# Patient Record
Sex: Female | Born: 1980 | Race: Black or African American | Hispanic: No | Marital: Single | Smoking: Former smoker
Health system: Southern US, Community
[De-identification: ages and names within clinical notes are randomized; demographics above are authoritative.]

## PROBLEM LIST (undated history)

## (undated) DIAGNOSIS — D649 Anemia, unspecified: Secondary | ICD-10-CM

---

## 2000-12-26 ENCOUNTER — Emergency Department (HOSPITAL_COMMUNITY): Admission: EM | Admit: 2000-12-26 | Discharge: 2000-12-27 | Payer: Self-pay | Admitting: Emergency Medicine

## 2002-08-01 ENCOUNTER — Encounter (INDEPENDENT_AMBULATORY_CARE_PROVIDER_SITE_OTHER): Payer: Self-pay | Admitting: *Deleted

## 2002-08-04 ENCOUNTER — Other Ambulatory Visit: Admission: RE | Admit: 2002-08-04 | Discharge: 2002-08-04 | Payer: Self-pay | Admitting: Sports Medicine

## 2002-08-04 ENCOUNTER — Encounter: Admission: RE | Admit: 2002-08-04 | Discharge: 2002-08-04 | Payer: Self-pay | Admitting: Sports Medicine

## 2004-07-10 ENCOUNTER — Emergency Department (HOSPITAL_COMMUNITY): Admission: EM | Admit: 2004-07-10 | Discharge: 2004-07-10 | Payer: Self-pay | Admitting: Emergency Medicine

## 2006-02-21 ENCOUNTER — Inpatient Hospital Stay (HOSPITAL_COMMUNITY): Admission: AD | Admit: 2006-02-21 | Discharge: 2006-02-21 | Payer: Self-pay | Admitting: Gynecology

## 2006-02-24 ENCOUNTER — Inpatient Hospital Stay (HOSPITAL_COMMUNITY): Admission: AD | Admit: 2006-02-24 | Discharge: 2006-02-24 | Payer: Self-pay | Admitting: Obstetrics and Gynecology

## 2006-10-30 ENCOUNTER — Encounter (INDEPENDENT_AMBULATORY_CARE_PROVIDER_SITE_OTHER): Payer: Self-pay | Admitting: *Deleted

## 2006-11-18 ENCOUNTER — Inpatient Hospital Stay (HOSPITAL_COMMUNITY): Admission: AD | Admit: 2006-11-18 | Discharge: 2006-11-18 | Payer: Self-pay | Admitting: Family Medicine

## 2007-02-24 ENCOUNTER — Inpatient Hospital Stay (HOSPITAL_COMMUNITY): Admission: AD | Admit: 2007-02-24 | Discharge: 2007-02-24 | Payer: Self-pay | Admitting: Obstetrics

## 2007-07-18 ENCOUNTER — Inpatient Hospital Stay (HOSPITAL_COMMUNITY): Admission: AD | Admit: 2007-07-18 | Discharge: 2007-07-21 | Payer: Self-pay | Admitting: Obstetrics

## 2008-05-08 IMAGING — US US OB COMP LESS 14 WK
1 series · 14 of 28 positions shown · non-contrast
Comparison: none

CLINICAL DATA: 8 weeks pregnant by dates, vaginal bleeding.  
 OBSTETRICAL ULTRASOUND <14 WKS AND TRANSVAGINAL OB US:
TECHNIQUE: Both transabdominal and transvaginal ultrasound examinations were performed for complete evaluation of the gestation as well as the maternal uterus, adnexal regions, and pelvic cul-de-sac.

[Series 1: us ob comp less 14 wks · 14 of 29 slices shown]
[im 2/29]
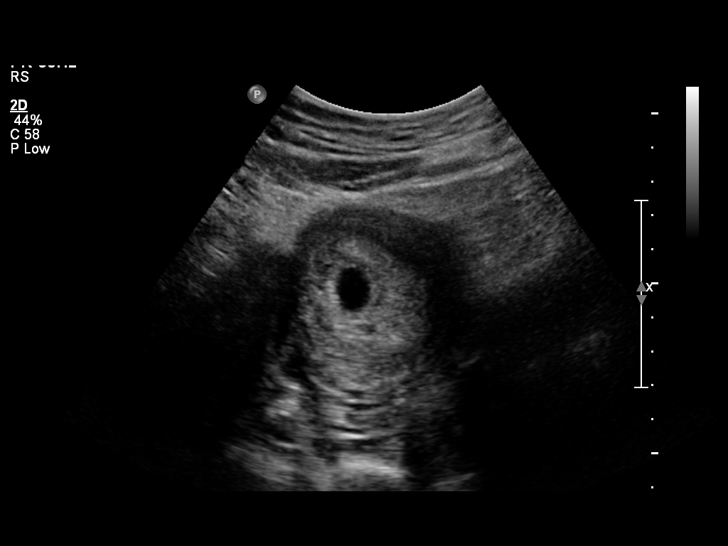
[im 4/29]
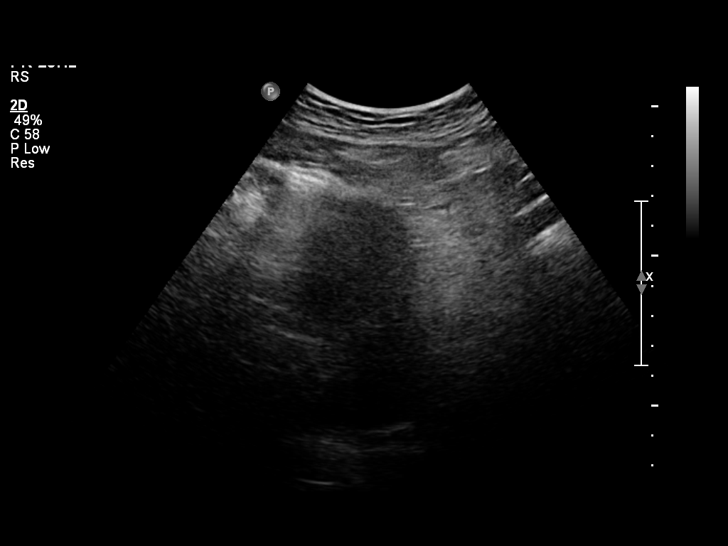
[im 6/29]
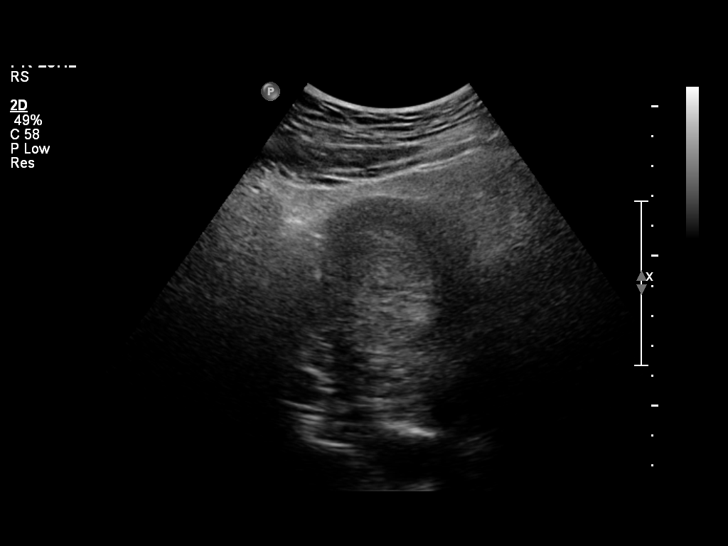
[im 8/29]
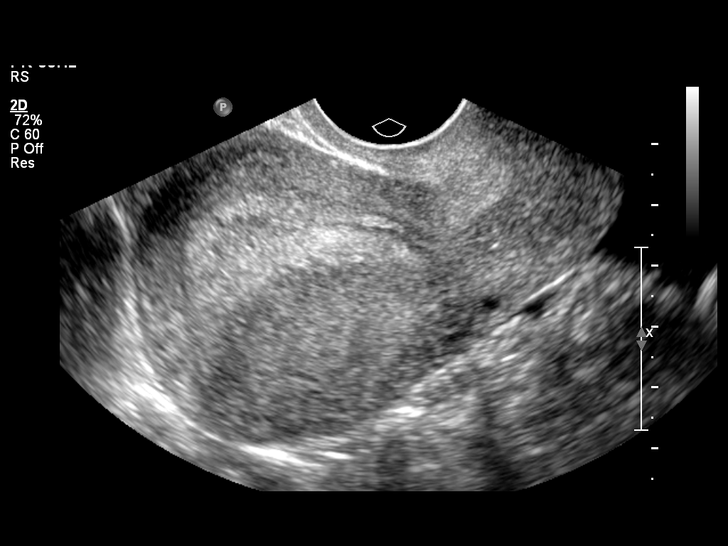
[im 10/29]
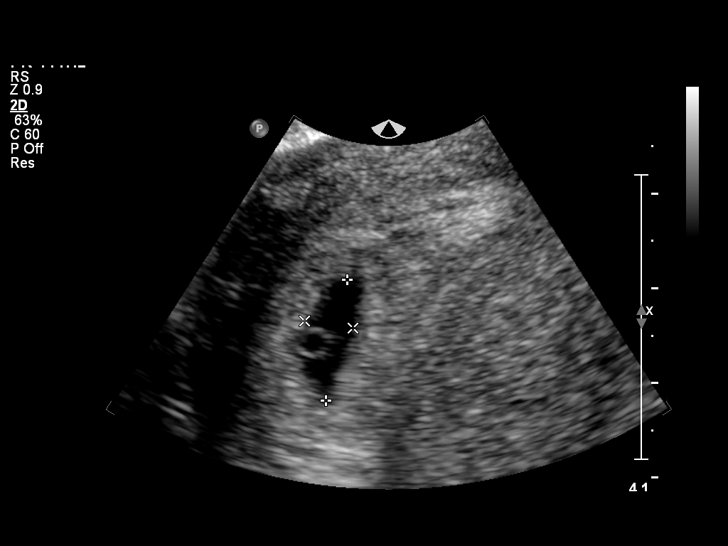
[im 12/29]
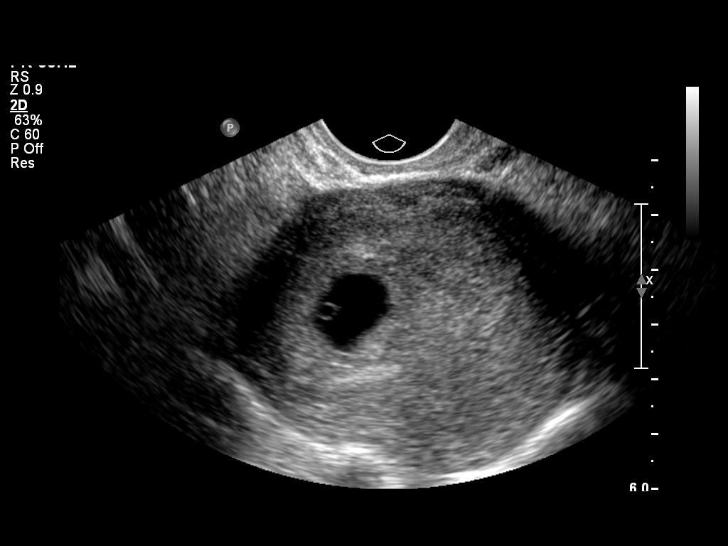
[im 14/29]
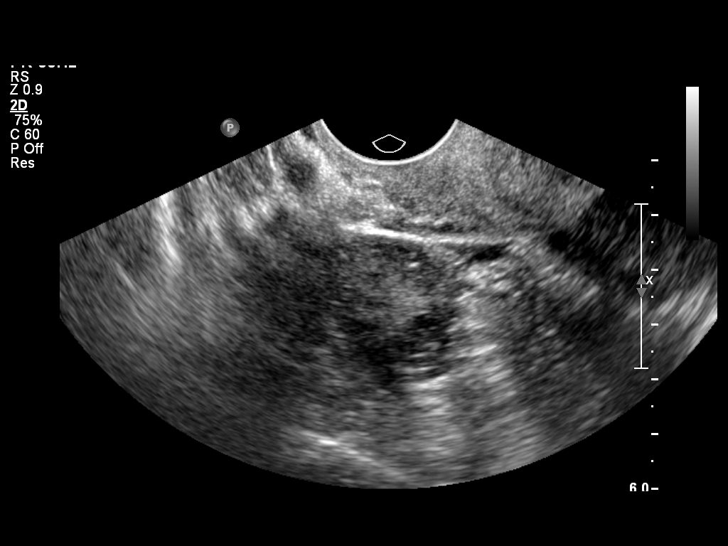
[im 16/29]
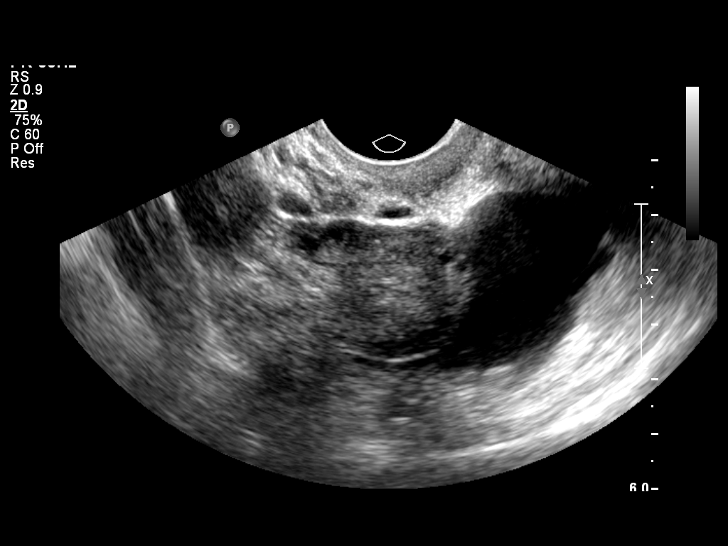
[im 18/29]
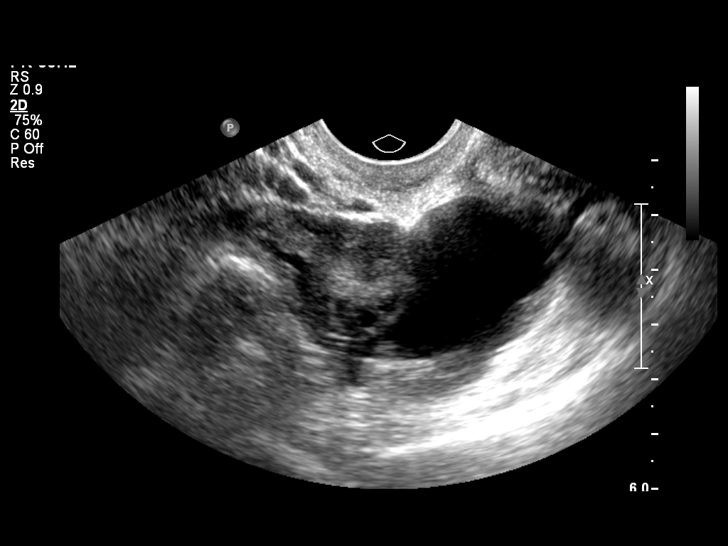
[im 20/29]
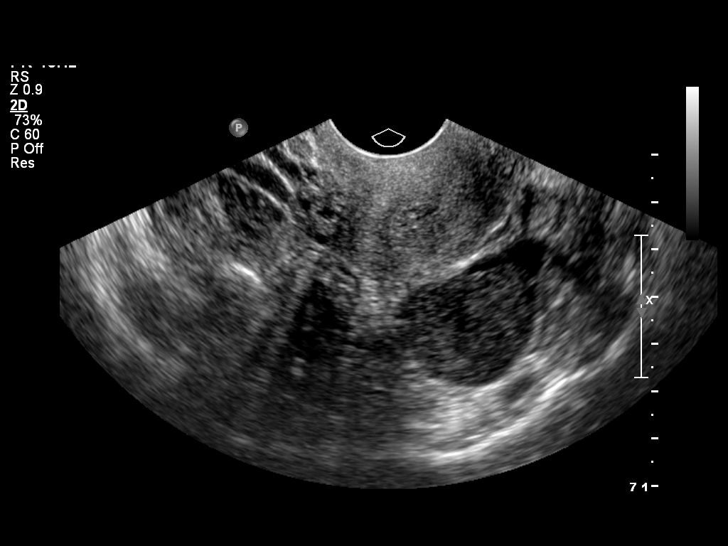
[im 22/29]
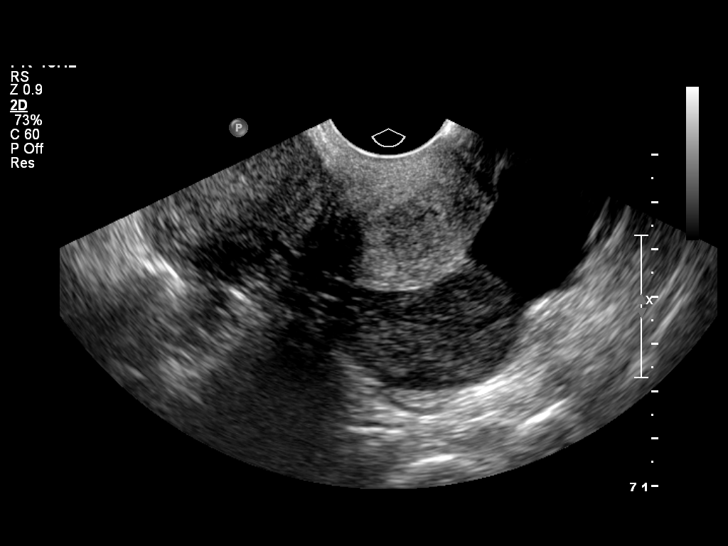
[im 24/29]
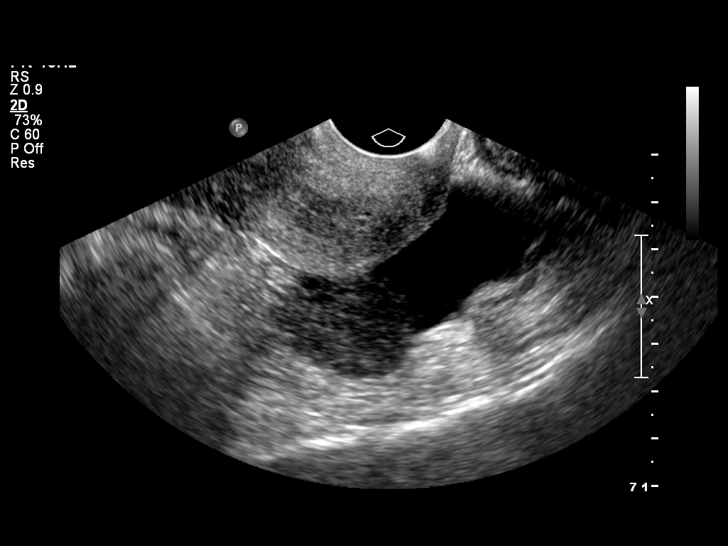
[im 26/29]
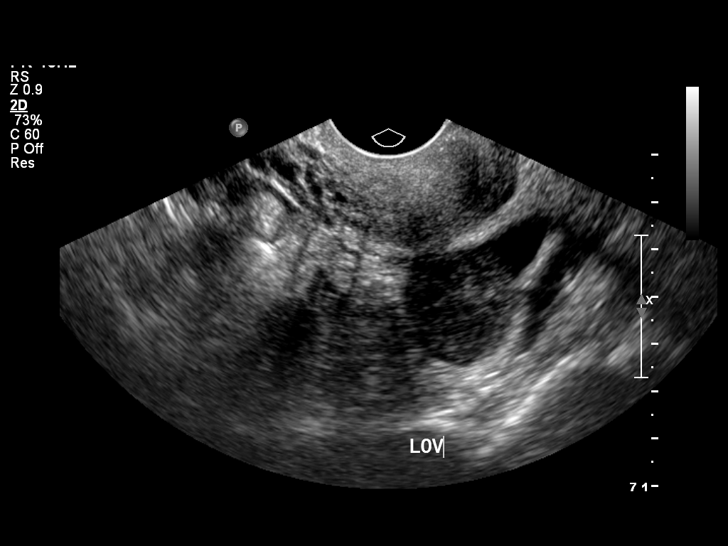
[im 29/29]
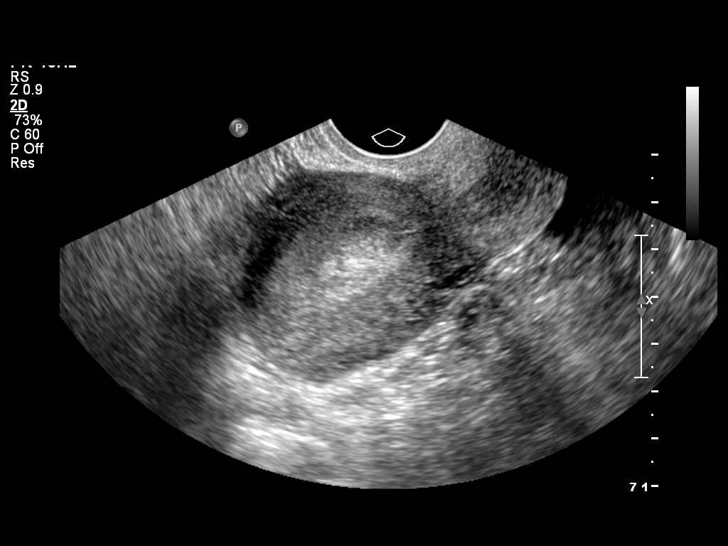

[14 of 28 positions shown; findings below may reference images not displayed]

FINDINGS: There is an intrauterine gestational sac with yolk sac present but no cardiac activity or fetal pole yet visualized.  Gestational age 5 weeks 5 days by mean sac diameter of 1 cm.  Ovaries are normal.  Small to moderate free fluid present.
IMPRESSION: Intrauterine gestational sac with average ultrasound age of 5 weeks 5 days by mean sac diameter.  No fetal pole or cardiac activity yet visualized.

## 2011-01-14 NOTE — Op Note (Signed)
NAMEGRAVIELA, Kayla Proctor                  ACCOUNT NO.:  [id number]   MEDICAL RECORD NO.:  [id number]          PATIENT TYPE:  INP   LOCATION:  9163                          FACILITY:  [facility]   PHYSICIAN:  Kathreen Cosier, M.D.DATE OF BIRTH:  02-25-1981   DATE OF PROCEDURE:  07/19/2007  DATE OF DISCHARGE:                               OPERATIVE REPORT   PROCEDURE PERFORMED:  Vacuum application and delivery.   PREOPERATIVE DIAGNOSIS:  Application of vacuum at +3 station.   POSTOPERATIVE DIAGNOSIS:  Application of vacuum at +3 station.   PROCEDURE:  The patient was fully dilated for more than an hour and was  offered vacuum because she was tired and she accepted.  Midline  episiotomy was performed and the vacuum applied at +3 station.  She had  a normal vaginal delivery from the LOA position of a female Apgar 08/09,  weighing 7 pounds 5 ounces.  There was a fourth degree extension and the  rectal mucosa was repaired in two layers and the anal sphincter repaired  in the usual manner.  Post repair the rectum was intact and the  sphincter was intact.  The placenta was delivered spontaneously. During  application of the vacuum, there was no pop-off and she delivered during  one contraction.           ______________________________  Kathreen Cosier, M.D.     BAM/MEDQ  D:  07/19/2007  T:  07/19/2007  Job:  [id number]

## 2011-06-10 LAB — CBC
HCT: 29 — ABNORMAL LOW
Hemoglobin: 11.4 — ABNORMAL LOW
MCHC: 32.6
MCV: 78.9
MCV: 79.8
Platelets: 242
RBC: 4.41
RDW: 17.4 — ABNORMAL HIGH

## 2011-06-18 LAB — URINALYSIS, ROUTINE W REFLEX MICROSCOPIC
Bilirubin Urine: NEGATIVE
Glucose, UA: NEGATIVE
Hgb urine dipstick: NEGATIVE
Ketones, ur: NEGATIVE
Nitrite: NEGATIVE
Protein, ur: NEGATIVE
Specific Gravity, Urine: 1.02
Urobilinogen, UA: 0.2
pH: 7

## 2012-05-10 ENCOUNTER — Encounter (HOSPITAL_COMMUNITY): Payer: Self-pay | Admitting: Family Medicine

## 2012-05-10 ENCOUNTER — Emergency Department (HOSPITAL_COMMUNITY)
Admission: EM | Admit: 2012-05-10 | Discharge: 2012-05-10 | Disposition: A | Discharge status: Home / Self Care | Payer: Medicare Other | Attending: Emergency Medicine | Admitting: Emergency Medicine

## 2012-05-10 DIAGNOSIS — J309 Allergic rhinitis, unspecified: Secondary | ICD-10-CM | POA: Insufficient documentation

## 2012-05-10 DIAGNOSIS — Z87891 Personal history of nicotine dependence: Secondary | ICD-10-CM | POA: Insufficient documentation

## 2012-05-10 DIAGNOSIS — J302 Other seasonal allergic rhinitis: Secondary | ICD-10-CM

## 2012-05-10 MED ORDER — LORATADINE 10 MG PO TABS: 10.0000 mg | ORAL_TABLET | Freq: Every day | ORAL | Status: DC

## 2012-05-10 NOTE — ED Provider Notes (Signed)
History     CSN: [id number]  Arrival date & time 05/10/12  1327   First MD Initiated Contact with Patient 05/10/12 1815      Chief Complaint  Patient presents with  . Nasal Congestion  . Shortness of Breath    (Consider location/radiation/quality/duration/timing/severity/associated sxs/prior treatment) Patient is a 31 y.o. female presenting with URI. The history is provided by the patient.  URI The primary symptoms include swollen glands and cough. Primary symptoms do not include fever, abdominal pain, nausea or vomiting.  Symptoms associated with the illness include facial pain, sinus pressure, congestion and rhinorrhea. Associated symptoms comments: Symptoms of nasal congestion, runny nose, facial pressure, mild cough and sensation of wheezing that she gets about the same time every year. No fever, N, V..    History reviewed. No pertinent past medical history.  History reviewed. No pertinent past surgical history.  History reviewed. No pertinent family history.  History  Substance Use Topics  . Smoking status: Former Smoker    Types: Cigarettes    Quit date: 05/03/2011  . Smokeless tobacco: Not on file  . Alcohol Use: No    OB History    Grav Para Term Preterm Abortions TAB SAB Ect Mult Living                  Review of Systems  Constitutional: Negative for fever.  HENT: Positive for congestion, rhinorrhea, sneezing and sinus pressure.   Eyes: Negative for discharge and itching.  Respiratory: Positive for cough and shortness of breath.   Cardiovascular: Negative for chest pain.  Gastrointestinal: Negative for nausea, vomiting and abdominal pain.  Genitourinary: Negative for dysuria.    Allergies  Review of patient's allergies indicates no known allergies.  Home Medications  No current outpatient prescriptions on file.  BP 110/67  Pulse 83  Temp 98.5 F (36.9 C) (Oral)  Resp 20  SpO2 100%  LMP 04/16/2012  Physical Exam  Constitutional: She appears  well-developed and well-nourished. No distress.  HENT:  Head: Normocephalic.  Right Ear: Tympanic membrane is not injected and not erythematous. A middle ear effusion is present.  Left Ear: Tympanic membrane is not injected and not erythematous.  No middle ear effusion.  Nose: Mucosal edema and rhinorrhea present.  Mouth/Throat: Mucous membranes are not dry. No oropharyngeal exudate or posterior oropharyngeal erythema.  Eyes: Conjunctivae are normal. Pupils are equal, round, and reactive to light.  Neck: Normal range of motion.  Cardiovascular: Normal rate and regular rhythm.   No murmur heard. Pulmonary/Chest: Effort normal. She has no wheezes. She has no rales. She exhibits no tenderness.  Abdominal: Soft. There is no tenderness.  Lymphadenopathy:    She has no cervical adenopathy.    ED Course  Procedures (including critical care time)  Labs Reviewed - No data to display No results found.   No diagnosis found.  1. Allergies   MDM  Suspect seasonal allergies.        Rodena Medin, PA-C 05/10/12 1919

## 2012-05-10 NOTE — ED Notes (Signed)
Pt reports having nasal congestion and sob occasionally. States it started last week where she gets sob on occasion. Pt states this feels similar to when she had a sinus infection previously. Denies any pain.

## 2012-05-13 NOTE — ED Provider Notes (Signed)
Medical screening examination/treatment/procedure(s) were performed by non-physician practitioner and as supervising physician I was immediately available for consultation/collaboration.   Zayvion Stailey L Mar Walmer, MD 05/13/12 1027 

## 2012-07-13 ENCOUNTER — Emergency Department (INDEPENDENT_AMBULATORY_CARE_PROVIDER_SITE_OTHER)
Admission: EM | Admit: 2012-07-13 | Discharge: 2012-07-13 | Disposition: A | Discharge status: Home / Self Care | Payer: Medicare Other | Source: Home / Self Care | Attending: Emergency Medicine | Admitting: Emergency Medicine

## 2012-07-13 ENCOUNTER — Encounter (HOSPITAL_COMMUNITY): Payer: Self-pay | Admitting: Emergency Medicine

## 2012-07-13 DIAGNOSIS — J45909 Unspecified asthma, uncomplicated: Secondary | ICD-10-CM

## 2012-07-13 DIAGNOSIS — J029 Acute pharyngitis, unspecified: Secondary | ICD-10-CM

## 2012-07-13 HISTORY — DX: Anemia, unspecified: D64.9

## 2012-07-13 MED ORDER — ALBUTEROL SULFATE HFA 108 (90 BASE) MCG/ACT IN AERS: 1.0000 | INHALATION_SPRAY | Freq: Four times a day (QID) | RESPIRATORY_TRACT | Status: DC | PRN

## 2012-07-13 MED ORDER — PREDNISONE 20 MG PO TABS: 20.0000 mg | ORAL_TABLET | Freq: Every day | ORAL | Status: DC

## 2012-07-13 NOTE — ED Notes (Signed)
Pt having congestion, dyspnea, and sob since last night when taking a shower. She has also had popping in her ears and a runny nose.

## 2012-07-13 NOTE — ED Provider Notes (Signed)
History     CSN: [id number]  Arrival date & time 07/13/12  1732   First MD Initiated Contact with Patient 07/13/12 1739      No chief complaint on file.   (Consider location/radiation/quality/duration/timing/severity/associated sxs/prior treatment) HPI Comments: Patient presents urgent care describing that after she took a shower last night she's been congested of her nose and her throat felt congested and with phlegm. Has also been coughing denies any fevers or shortness of breath at this point. Has some difficulty swallowing denies any facial swelling tongue swelling or generalized rash is generalized malaise.  The history is provided by the patient.    No past medical history on file.  No past surgical history on file.  No family history on file.  History  Substance Use Topics  . Smoking status: Former Smoker    Types: Cigarettes    Quit date: 05/03/2011  . Smokeless tobacco: Not on file  . Alcohol Use: No    OB History    Grav Para Term Preterm Abortions TAB SAB Ect Mult Living                  Review of Systems  Constitutional: Positive for appetite change. Negative for fever and chills.  HENT: Positive for congestion, sore throat, rhinorrhea and trouble swallowing. Negative for ear pain, neck pain and sinus pressure.   Eyes: Negative for discharge.  Respiratory: Positive for cough. Negative for chest tightness, shortness of breath, wheezing and stridor.   Genitourinary: Negative for dysuria.  Skin: Negative for color change and rash.  Neurological: Negative for dizziness, tremors and headaches.    Allergies  Review of patient's allergies indicates no known allergies.  Home Medications   Current Outpatient Rx  Name  Route  Sig  Dispense  Refill  . LORATADINE 10 MG PO TABS   Oral   Take 1 tablet (10 mg total) by mouth daily.   7 tablet   0     BP 109/68  Pulse 101  Temp 98.5 F (36.9 C) (Oral)  Resp 18  SpO2 100%  Physical Exam  Nursing  note and vitals reviewed. Constitutional: She appears well-developed and well-nourished.  HENT:  Head: Normocephalic.  Right Ear: Tympanic membrane normal.  Left Ear: Tympanic membrane normal.  Mouth/Throat: Uvula is midline and mucous membranes are normal. Posterior oropharyngeal erythema present. No oropharyngeal exudate, posterior oropharyngeal edema or tonsillar abscesses.  Eyes: Conjunctivae normal are normal. Right eye exhibits no discharge. Left eye exhibits no discharge.  Neck: Trachea normal. Neck supple. No JVD present. No thyromegaly present.  Cardiovascular: Normal rate.   No murmur heard. Pulmonary/Chest: Effort normal and breath sounds normal. No respiratory distress.  Lymphadenopathy:    She has no cervical adenopathy.  Skin: Skin is warm. No erythema.    ED Course  Procedures (including critical care time)  Labs Reviewed - No data to display No results found.   No diagnosis found.    MDM  Sore throat Normal exam- prednisone and codeine with guafinesin        Jimmie Molly, MD 07/13/12 1843

## 2012-09-17 ENCOUNTER — Encounter (HOSPITAL_COMMUNITY): Payer: Self-pay | Admitting: *Deleted

## 2012-09-17 ENCOUNTER — Emergency Department (HOSPITAL_COMMUNITY)
Admission: EM | Admit: 2012-09-17 | Discharge: 2012-09-17 | Disposition: A | Discharge status: Home / Self Care | Payer: Medicare Other | Attending: Emergency Medicine | Admitting: Emergency Medicine

## 2012-09-17 DIAGNOSIS — R079 Chest pain, unspecified: Secondary | ICD-10-CM | POA: Insufficient documentation

## 2012-09-17 DIAGNOSIS — D649 Anemia, unspecified: Secondary | ICD-10-CM | POA: Insufficient documentation

## 2012-09-17 DIAGNOSIS — R0681 Apnea, not elsewhere classified: Secondary | ICD-10-CM | POA: Insufficient documentation

## 2012-09-17 DIAGNOSIS — K089 Disorder of teeth and supporting structures, unspecified: Secondary | ICD-10-CM | POA: Insufficient documentation

## 2012-09-17 DIAGNOSIS — Z87891 Personal history of nicotine dependence: Secondary | ICD-10-CM | POA: Insufficient documentation

## 2012-09-17 DIAGNOSIS — K219 Gastro-esophageal reflux disease without esophagitis: Secondary | ICD-10-CM | POA: Insufficient documentation

## 2012-09-17 DIAGNOSIS — K0889 Other specified disorders of teeth and supporting structures: Secondary | ICD-10-CM

## 2012-09-17 DIAGNOSIS — Z79899 Other long term (current) drug therapy: Secondary | ICD-10-CM | POA: Insufficient documentation

## 2012-09-17 LAB — POCT I-STAT, CHEM 8
Calcium, Ion: 1.22 mmol/L (ref 1.12–1.23)
Creatinine, Ser: 0.7 mg/dL (ref 0.50–1.10)
Hemoglobin: 10.9 g/dL — ABNORMAL LOW (ref 12.0–15.0)
Sodium: 142 mEq/L (ref 135–145)
TCO2: 24 mmol/L (ref 0–100)

## 2012-09-17 LAB — CBC
Hemoglobin: 9.8 g/dL — ABNORMAL LOW (ref 12.0–15.0)
MCH: 22.4 pg — ABNORMAL LOW (ref 26.0–34.0)
MCHC: 29.5 g/dL — ABNORMAL LOW (ref 30.0–36.0)

## 2012-09-17 LAB — POCT I-STAT TROPONIN I

## 2012-09-17 MED ORDER — PENICILLIN V POTASSIUM 500 MG PO TABS: 500.0000 mg | ORAL_TABLET | Freq: Three times a day (TID) | ORAL | Status: DC

## 2012-09-17 MED ORDER — GI COCKTAIL ~~LOC~~
30.0000 mL | Freq: Once | ORAL | Status: AC
  Administered 2012-09-17: 30 mL via ORAL
  Filled 2012-09-17: qty 30

## 2012-09-17 MED ORDER — FAMOTIDINE 20 MG PO TABS
20.0000 mg | ORAL_TABLET | Freq: Once | ORAL | Status: AC
  Administered 2012-09-17: 20 mg via ORAL
  Filled 2012-09-17: qty 1

## 2012-09-17 MED ORDER — PANTOPRAZOLE SODIUM 40 MG PO TBEC
40.0000 mg | DELAYED_RELEASE_TABLET | Freq: Once | ORAL | Status: AC
  Administered 2012-09-17: 40 mg via ORAL
  Filled 2012-09-17: qty 1

## 2012-09-17 MED ORDER — PANTOPRAZOLE SODIUM 40 MG PO TBEC: 40.0000 mg | DELAYED_RELEASE_TABLET | Freq: Every day | ORAL | Status: DC

## 2012-09-17 MED ORDER — OMEPRAZOLE 20 MG PO CPDR: 20.0000 mg | DELAYED_RELEASE_CAPSULE | Freq: Every day | ORAL | Status: DC

## 2012-09-17 NOTE — ED Notes (Signed)
Dental pain: rt. Lower . Chest tightness when laying down.

## 2012-09-17 NOTE — ED Provider Notes (Signed)
History     CSN: [id number]  Arrival date & time 09/17/12  0227   First MD Initiated Contact with Patient 09/17/12 [phone]      Chief Complaint  Patient presents with  . Dental Pain  . Chest Pain    (Consider location/radiation/quality/duration/timing/severity/associated sxs/prior treatment) Patient is a 32 y.o. female presenting with tooth pain and chest pain. The history is provided by the patient. No language interpreter was used.  Dental PainPrimary symptoms do not include cough. Primary symptoms comment: tooth pain The symptoms began 2 days ago. The symptoms are unchanged. The symptoms are new. The symptoms occur constantly.  Additional symptoms include: dental sensitivity to temperature. Additional symptoms do not include: gum swelling and pain with swallowing. Medical issues do not include: alcohol problem.   Chest Pain The chest pain began 2 days ago. Chest pain occurs constantly. The chest pain is unchanged. The pain is associated with eating. The severity of the pain is mild. The quality of the pain is described as burning (worst when laying flat but burning and rising x 2 days ). The pain does not radiate. Chest pain is worsened by eating. Pertinent negatives for primary symptoms include no cough, no wheezing, no nausea and no vomiting. Primary symptoms comment: tooth pain  Pertinent negatives for associated symptoms include no diaphoresis. She tried nothing for the symptoms. There are no known risk factors.  Pertinent negatives for past medical history include no CAD and no PE.   PERC negative and wells 0  Past Medical History  Diagnosis Date  . Anemia     History reviewed. No pertinent past surgical history.  History reviewed. No pertinent family history.  History  Substance Use Topics  . Smoking status: Former Smoker    Types: Cigarettes    Quit date: 05/03/2011  . Smokeless tobacco: Not on file  . Alcohol Use: No    OB History    Grav Para Term Preterm  Abortions TAB SAB Ect Mult Living                  Review of Systems  Constitutional: Negative for diaphoresis.  Respiratory: Negative for cough and wheezing.   Cardiovascular: Positive for chest pain.  Gastrointestinal: Negative for nausea and vomiting.  All other systems reviewed and are negative.    Allergies  Review of patient's allergies indicates no known allergies.  Home Medications   Current Outpatient Rx  Name  Route  Sig  Dispense  Refill  . ALBUTEROL SULFATE HFA 108 (90 BASE) MCG/ACT IN AERS   Inhalation   Inhale 1-2 puffs into the lungs every 6 (six) hours as needed for wheezing.   1 Inhaler   0   . FERROUS SULFATE 325 (65 FE) MG PO TABS   Oral   Take 325 mg by mouth daily with breakfast.         . LORATADINE 10 MG PO TABS   Oral   Take 1 tablet (10 mg total) by mouth daily.   7 tablet   0     BP 116/58  Pulse 81  Temp 98.4 F (36.9 C) (Oral)  Resp 20  SpO2 100%  LMP 08/26/2012  Physical Exam  Constitutional: She is oriented to person, place, and time. She appears well-developed and well-nourished. No distress.  HENT:  Head: Normocephalic and atraumatic.  Mouth/Throat: Oropharynx is clear and moist.    Eyes: Conjunctivae normal are normal. Pupils are equal, round, and reactive to light.  Neck: Normal  range of motion. Neck supple.  Cardiovascular: Normal rate, regular rhythm and intact distal pulses.   Pulmonary/Chest: Effort normal and breath sounds normal. She has no wheezes. She has no rales.  Abdominal: Soft. Bowel sounds are normal. There is no tenderness. There is no rebound and no guarding.  Musculoskeletal: Normal range of motion.  Neurological: She is alert and oriented to person, place, and time. She has normal reflexes.  Skin: Skin is warm and dry. She is not diaphoretic.  Psychiatric: She has a normal mood and affect.    ED Course  Procedures (including critical care time)  Labs Reviewed  CBC - Abnormal; Notable for the  following:    Hemoglobin 9.8 (*)     HCT 33.2 (*)     MCV 75.8 (*)     MCH 22.4 (*)     MCHC 29.5 (*)     All other components within normal limits  POCT I-STAT, CHEM 8 - Abnormal; Notable for the following:    Hemoglobin 10.9 (*)     HCT 32.0 (*)     All other components within normal limits  POCT I-STAT TROPONIN I   No results found.   No diagnosis found.    MDM  Sx consistent with GERD.  In the setting of > 8 hours of ongoing indigestion symptoms now relieve of GI cocktail and PPI, one negative troponin and EKG is sufficient to exclude ACS.  Follow up with your doctor and dentist.  Return for chest pain shortness of breath or any concerns    Date: 09/17/2012  Rate: 85   Rhythm: normal sinus rhythm  QRS Axis: normal  Intervals: normal  ST/T Wave abnormalities: normal  Conduction Disutrbances: PAC  Narrative Interpretation: unremarkable         Lillyona Polasek Smitty Cords, MD 09/17/12 [phone]

## 2012-10-18 ENCOUNTER — Encounter (HOSPITAL_COMMUNITY): Payer: Self-pay | Admitting: Emergency Medicine

## 2012-10-18 ENCOUNTER — Emergency Department (INDEPENDENT_AMBULATORY_CARE_PROVIDER_SITE_OTHER)
Admission: EM | Admit: 2012-10-18 | Discharge: 2012-10-18 | Disposition: A | Discharge status: Home / Self Care | Payer: Medicare Other | Source: Home / Self Care

## 2012-10-18 DIAGNOSIS — J329 Chronic sinusitis, unspecified: Secondary | ICD-10-CM

## 2012-10-18 MED ORDER — ACETAMINOPHEN-CODEINE #3 300-30 MG PO TABS: 1.0000 | ORAL_TABLET | Freq: Four times a day (QID) | ORAL | Status: DC | PRN

## 2012-10-18 MED ORDER — PHENYLEPHRINE-CHLORPHEN-DM 10-4-12.5 MG/5ML PO LIQD: 5.0000 mL | ORAL | Status: DC | PRN

## 2012-10-18 NOTE — ED Notes (Signed)
Waiting discharge papers 

## 2012-10-18 NOTE — ED Provider Notes (Signed)
History     CSN: [id number]  Arrival date & time 10/18/12  1751   First MD Initiated Contact with Patient 10/18/12 1759      Chief Complaint  Patient presents with  . URI    cough and congestion with headache.     (Consider location/radiation/quality/duration/timing/severity/associated sxs/prior treatment) HPI Comments: This 32 year old female is complaining of nasal congestion and facial pressure associated with headache for 2 days. She together in her house 2 days ago and others that were in the house developed similar symptoms. She states since last summer she has had intermittent sinusitis symptoms that she has attributed to mold that has been found in the house. She states her house is several decades old and knows of places in the home that he is molded. She denies fever, chills or sore throat. She does have nasal congestion facial pressure especially with leaning forward and a headache in the frontal and left parietal areas. Many of these symptoms are intermittent. She denies focal neurologic deficits. No problems with vision speech hearing or swallowing.   Past Medical History  Diagnosis Date  . Anemia     History reviewed. No pertinent past surgical history.  History reviewed. No pertinent family history.  History  Substance Use Topics  . Smoking status: Former Smoker    Types: Cigarettes    Quit date: 05/03/2011  . Smokeless tobacco: Not on file  . Alcohol Use: No    OB History   Grav Para Term Preterm Abortions TAB SAB Ect Mult Living                  Review of Systems  Constitutional: Negative for fever, chills, activity change, appetite change and fatigue.  HENT: Positive for congestion, rhinorrhea, postnasal drip and sinus pressure. Negative for sore throat, facial swelling, neck pain and neck stiffness.   Eyes: Negative.   Respiratory: Negative.   Cardiovascular: Negative.   Genitourinary: Negative.   Skin: Negative for pallor and rash.  Neurological:  Positive for light-headedness and headaches.    Allergies  Review of patient's allergies indicates no known allergies.  Home Medications   Current Outpatient Rx  Name  Route  Sig  Dispense  Refill  . omeprazole (PRILOSEC) 20 MG capsule   Oral   Take 1 capsule (20 mg total) by mouth daily.   30 capsule   0   . acetaminophen-codeine (TYLENOL #3) 300-30 MG per tablet   Oral   Take 1-2 tablets by mouth every 6 (six) hours as needed for pain.   15 tablet   0   . albuterol (PROVENTIL HFA;VENTOLIN HFA) 108 (90 BASE) MCG/ACT inhaler   Inhalation   Inhale 1-2 puffs into the lungs every 6 (six) hours as needed for wheezing.   1 Inhaler   0   . ferrous sulfate 325 (65 FE) MG tablet   Oral   Take 325 mg by mouth daily with breakfast.         . loratadine (CLARITIN) 10 MG tablet   Oral   Take 1 tablet (10 mg total) by mouth daily.   7 tablet   0   . penicillin v potassium (VEETID) 500 MG tablet   Oral   Take 1 tablet (500 mg total) by mouth 3 (three) times daily.   30 tablet   0   . Phenylephrine-Chlorphen-DM 06-05-11.5 MG/5ML LIQD   Oral   Take 5 mLs by mouth every 4 (four) hours as needed.   120 mL  0     BP 115/69  Pulse 77  Temp(Src) 97.5 F (36.4 C) (Oral)  Resp 18  SpO2 100%  LMP 09/26/2012  Physical Exam  Nursing note and vitals reviewed. Constitutional: She is oriented to person, place, and time. She appears well-developed and well-nourished. No distress.  HENT:  Head: Normocephalic.  Bilateral TMs are normal Oropharynx with mild erythema and clear and pale green drainage. No exudate  Eyes: Conjunctivae and EOM are normal.  Neck: Normal range of motion. Neck supple.  Cardiovascular: Normal rate, regular rhythm and normal heart sounds.   Pulmonary/Chest: Effort normal and breath sounds normal. No respiratory distress. She has no wheezes. She has no rales.  Musculoskeletal: Normal range of motion. She exhibits no edema.  Lymphadenopathy:    She has  no cervical adenopathy.  Neurological: She is alert and oriented to person, place, and time.  Skin: Skin is warm and dry. No rash noted.  Psychiatric: She has a normal mood and affect.    ED Course  Procedures (including critical care time)  Labs Reviewed - No data to display No results found.   1. Sinusitis       MDM  Is difficult to know whether the sinusitis is caused by mold and a home or not. Most likely is due to a virus. I would recommend you call the health department tomorrow and asked him what to do to test for mold in the home.. In the meantime may take Norell CS 1 teaspoon every 4 hours when necessary cough and congestion. Drink plenty of fluids and stay well hydrated. Tylenol #3 one every 4-6 hours as needed for cough and facial pressure pain. Recommend staying away from the home for a few days his CPK better in the back to see if the symptoms started again this we challenged affect may be helpful in diagnosis the etiology of her chronic intermittent sinusitis.         Hayden Rasmussen, NP 10/18/12 [phone]

## 2012-10-18 NOTE — ED Notes (Signed)
Pt has concerns with congestion and headache. Pt states that she lives in an old house and feels that mold is present which is causing her symptoms.  Pt has not tried any medications for symptoms.

## 2012-10-18 NOTE — ED Provider Notes (Signed)
Medical screening examination/treatment/procedure(s) were performed by resident physician or non-physician practitioner and as supervising physician I was immediately available for consultation/collaboration.   Nyia Tsao DOUGLAS MD.   Trinidee Schrag D Saud Bail, MD 10/18/12 1957 

## 2013-02-16 NOTE — ED Notes (Signed)
Chart review.

## 2017-11-24 ENCOUNTER — Emergency Department (HOSPITAL_COMMUNITY)
Admission: EM | Admit: 2017-11-24 | Discharge: 2017-11-24 | Disposition: A | Discharge status: Home / Self Care | Payer: Medicare Other | Attending: Emergency Medicine | Admitting: Emergency Medicine

## 2017-11-24 ENCOUNTER — Encounter (HOSPITAL_COMMUNITY): Payer: Self-pay

## 2017-11-24 ENCOUNTER — Other Ambulatory Visit: Payer: Self-pay

## 2017-11-24 DIAGNOSIS — Y929 Unspecified place or not applicable: Secondary | ICD-10-CM | POA: Insufficient documentation

## 2017-11-24 DIAGNOSIS — S29012A Strain of muscle and tendon of back wall of thorax, initial encounter: Secondary | ICD-10-CM | POA: Diagnosis not present

## 2017-11-24 DIAGNOSIS — Y939 Activity, unspecified: Secondary | ICD-10-CM | POA: Diagnosis not present

## 2017-11-24 DIAGNOSIS — Z87891 Personal history of nicotine dependence: Secondary | ICD-10-CM | POA: Diagnosis not present

## 2017-11-24 DIAGNOSIS — S29002A Unspecified injury of muscle and tendon of back wall of thorax, initial encounter: Secondary | ICD-10-CM | POA: Diagnosis present

## 2017-11-24 DIAGNOSIS — Z79899 Other long term (current) drug therapy: Secondary | ICD-10-CM | POA: Diagnosis not present

## 2017-11-24 DIAGNOSIS — X509XXA Other and unspecified overexertion or strenuous movements or postures, initial encounter: Secondary | ICD-10-CM | POA: Insufficient documentation

## 2017-11-24 DIAGNOSIS — M62830 Muscle spasm of back: Secondary | ICD-10-CM | POA: Insufficient documentation

## 2017-11-24 DIAGNOSIS — Y999 Unspecified external cause status: Secondary | ICD-10-CM | POA: Insufficient documentation

## 2017-11-24 DIAGNOSIS — T148XXA Other injury of unspecified body region, initial encounter: Secondary | ICD-10-CM

## 2017-11-24 DIAGNOSIS — M546 Pain in thoracic spine: Secondary | ICD-10-CM

## 2017-11-24 MED ORDER — NAPROXEN 500 MG PO TABS: 500.0000 mg | ORAL_TABLET | Freq: Two times a day (BID) | ORAL | 0 refills | Status: DC

## 2017-11-24 MED ORDER — CYCLOBENZAPRINE HCL 10 MG PO TABS: 10.0000 mg | ORAL_TABLET | Freq: Three times a day (TID) | ORAL | 0 refills | Status: DC | PRN

## 2017-11-24 NOTE — ED Provider Notes (Signed)
[facility] Provider Note   CSN: [id number] Arrival date & time: 11/24/17  1459     History   Chief Complaint Chief Complaint  Patient presents with  . Back Pain    HPI Kayla Proctor is a 37 y.o. female with a PMHx of anemia, who presents to the ED with complaints of right thoracic back pain x 1 month.  Patient states that she slipped on the ice about a month or so ago and fell on her right side, had pain ever since then which has been coming and going.  She went to an urgent care a few weeks ago and was told to perform exercises to help as well as take Advil, which she has been doing.  Today she woke up and felt a muscle spasm in her right upper back, thinks that she pulled something because she states that she does a lot of repetitive bending and overhead activities/stretching as well as heavy lifting, so she came here for repeat evaluation.  She describes the pain as 8/10 intermittent soreness in the right upper back, nonradiating, worse with laying down, and improved with Advil.  She was not prescribed anything when she went to the urgent care.  She states it feels like there's a spasm in her muscle.  She denies fevers, chills, CP, SOB, abd pain, N/V/D/C, hematuria, dysuria, incontinence of urine/stool, saddle anesthesia/cauda equina symptoms, myalgias, arthralgias, numbness, tingling, focal weakness, or any other complaints at this time.  She denies hx of CA or IVDU.   The history is provided by the patient and medical records. No language interpreter was used.  Back Pain   This is a recurrent problem. The current episode started more than 1 week ago. The problem occurs every several days. The problem has not changed since onset.The pain is associated with lifting heavy objects and a recent injury. The pain is present in the thoracic spine. Quality: soreness. The pain does not radiate. The pain is at a severity of 8/10. The pain is moderate. The symptoms  are aggravated by certain positions. The pain is the same all the time. Pertinent negatives include no chest pain, no fever, no numbness, no abdominal pain, no bowel incontinence, no perianal numbness, no bladder incontinence, no dysuria, no paresthesias, no paresis, no tingling and no weakness. She has tried NSAIDs for the symptoms. The treatment provided mild relief.    Past Medical History:  Diagnosis Date  . Anemia     There are no active problems to display for this patient.   History reviewed. No pertinent surgical history.   OB History   None      Home Medications    Prior to Admission medications   Medication Sig Start Date End Date Taking? Authorizing Provider  acetaminophen-codeine (TYLENOL #3) 300-30 MG per tablet Take 1-2 tablets by mouth every 6 (six) hours as needed for pain. 10/18/12   Hayden Rasmussen, NP  albuterol (PROVENTIL HFA;VENTOLIN HFA) 108 (90 BASE) MCG/ACT inhaler Inhale 1-2 puffs into the lungs every 6 (six) hours as needed for wheezing. 07/13/12   Jimmie Molly, MD  ferrous sulfate 325 (65 FE) MG tablet Take 325 mg by mouth daily with breakfast.    [provider]  loratadine (CLARITIN) 10 MG tablet Take 1 tablet (10 mg total) by mouth daily. 05/10/12 05/10/13  Elpidio Anis, PA-C  omeprazole (PRILOSEC) 20 MG capsule Take 1 capsule (20 mg total) by mouth daily. 09/17/12   Palumbo, April, MD  penicillin v potassium (VEETID) 500 MG tablet Take 1 tablet (500 mg total) by mouth 3 (three) times daily. 09/17/12   Palumbo, April, MD  Phenylephrine-Chlorphen-DM 06-05-11.5 MG/5ML LIQD Take 5 mLs by mouth every 4 (four) hours as needed. 10/18/12   Hayden RasmussenMabe, David, NP    Family History History reviewed. No pertinent family history.  Social History Social History   Tobacco Use  . Smoking status: Former Smoker    Types: Cigarettes    Last attempt to quit: 05/03/2011    Years since quitting: 6.5  . Smokeless tobacco: Former Engineer, waterUser  Substance Use Topics  . Alcohol use: No    . Drug use: No     Allergies   Patient has no known allergies.   Review of Systems Review of Systems  Constitutional: Negative for chills and fever.  Respiratory: Negative for shortness of breath.   Cardiovascular: Negative for chest pain.  Gastrointestinal: Negative for abdominal pain, bowel incontinence, constipation, diarrhea, nausea and vomiting.  Genitourinary: Negative for bladder incontinence, difficulty urinating (no incontinence), dysuria and hematuria.  Musculoskeletal: Positive for back pain. Negative for arthralgias and myalgias.  Skin: Negative for color change.  Allergic/Immunologic: Negative for immunocompromised state.  Neurological: Negative for tingling, weakness, numbness and paresthesias.  Psychiatric/Behavioral: Negative for confusion.   All other systems reviewed and are negative for acute change except as noted in the HPI.    Physical Exam Updated Vital Signs BP 133/66 (BP Location: Left Arm)   Pulse 91   Temp 98.4 F (36.9 C) (Oral)   Resp 18   Ht 5\' 5"  (1.651 m)   Wt 93.4 kg (206 lb)   LMP 11/13/2017   SpO2 100%   BMI 34.28 kg/m   Physical Exam  Constitutional: She is oriented to person, place, and time. Vital signs are normal. She appears well-developed and well-nourished.  Non-toxic appearance. No distress.  Afebrile, nontoxic, NAD  HENT:  Head: Normocephalic and atraumatic.  Mouth/Throat: Mucous membranes are normal.  Eyes: Conjunctivae and EOM are normal. Right eye exhibits no discharge. Left eye exhibits no discharge.  Neck: Normal range of motion. Neck supple.  Cardiovascular: Normal rate and intact distal pulses.  Pulmonary/Chest: Effort normal. No respiratory distress.  Abdominal: Normal appearance. She exhibits no distension. There is no CVA tenderness.  No CVA TTP  Musculoskeletal: Normal range of motion.       Right shoulder: Normal.       Thoracic back: She exhibits tenderness and spasm. She exhibits normal range of motion and  no bony tenderness.       Back:  Thoracic spine with FROM intact without spinous process TTP, no bony stepoffs or deformities, with mild R sided paraspinous muscle TTP and muscle spasms adjacent to the lower edge of the scapula, but no focal joint line TTP of the shoulder and FROM intact in the shoulder. Strength and sensation grossly intact in all extremities, negative SLR bilaterally, gait steady and nonantalgic. No overlying skin changes. Distal pulses intact.   Neurological: She is alert and oriented to person, place, and time. She has normal strength. No sensory deficit.  Skin: Skin is warm, dry and intact. No rash noted.  Psychiatric: She has a normal mood and affect. Her behavior is normal.  Nursing note and vitals reviewed.    ED Treatments / Results  Labs (all labs ordered are listed, but only abnormal results are displayed) Labs Reviewed - No data to display  EKG None  Radiology No results found.  Procedures Procedures (  including critical care time)  Medications Ordered in ED Medications - No data to display   Initial Impression / Assessment and Plan / ED Course  I have reviewed the triage vital signs and the nursing notes.  Pertinent labs & imaging results that were available during my care of the patient were reviewed by me and considered in my medical decision making (see chart for details).     37 y.o. female here with c/o R thoracic back pain x1 month since falling on her right side, and then also since doing a lot of bending and repetitive overhead activities. Thinks she pulled something. On exam, mild R sided paraspinous muscle TTP in the mid-thoracic region, adjacent to the scapula, but with FROM intact of the shoulders; no midline spinal tenderness. No red flag s/s of back pain. No s/s of central cord compression or cauda equina. Lower extremities are neurovascularly intact and patient is ambulating without difficulty. No urinary complaints or CVA TTP. Doubt  need for imaging/labs, likely muscular strain. Also doubt PE or cardiopulmonary etiology.   Patient was counseled on back pain precautions and told to do activity as tolerated but do not lift, push, or pull heavy objects more than 10 pounds for the next week. Patient counseled to use ice or heat on back for no longer than 15 minutes every hour.   Rx given for muscle relaxer and counseled on proper use of muscle relaxant medication. Urged patient not to drink alcohol, drive, or perform any other activities that requires focus while taking these medications. Rx for naprosyn given. Advised tylenol use as well.     Patient urged to follow-up with PCP if pain does not improve with treatment and rest or if pain becomes recurrent. Urged to return with worsening severe pain, loss of bowel or bladder control, trouble walking. The patient verbalizes understanding and agrees with the plan.    Final Clinical Impressions(s) / ED Diagnoses   Final diagnoses:  Acute right-sided thoracic back pain  Muscle spasm of back  Muscle strain    ED Discharge Orders        Ordered    cyclobenzaprine (FLEXERIL) 10 MG tablet  3 times daily PRN     11/24/17 1736    naproxen (NAPROSYN) 500 MG tablet  2 times daily with meals     11/24/17 [address], [place], [place] 11/24/17 1742    Mancel Bale, MD 11/25/17 [phone]

## 2017-11-24 NOTE — Discharge Instructions (Signed)
Back Pain: °Your back pain should be treated with medicines such as ibuprofen or aleve and this back pain should get better over the next 2 weeks.  However if you develop severe or worsening pain, low back pain with fever, numbness, weakness or inability to walk or urinate, you should return to the ER immediately.  Please follow up with your doctor this week for a recheck if still having symptoms.  Avoid heavy lifting over 10 pounds over the next two weeks. ° °Low back pain is discomfort in the lower back that may be due to injuries to muscles and ligaments around the spine.  Occasionally, it may be caused by a a problem to a part of the spine called a disc.  The pain may last several days or a week;  However, most patients get completely well in 4 weeks. ° °Self - care:  The application of heat can help soothe the pain.  Maintaining your daily activities, including walking, is encourged, as it will help you get better faster than just staying in bed. Perform gentle stretching as discussed. Drink plenty of fluids. ° °Medications are also useful to help with pain control.  A commonly prescribed medication includes over the counter Tylenol; take as directed on the bottle.  ° °Non steroidal anti inflammatory medications including Ibuprofen and naproxen;  These medications help both pain and swelling and are very useful in treating back pain.  They should be taken with food, as they can cause stomach upset, and more seriously, stomach bleeding.   ° °Muscle relaxants:  These medications can help with muscle tightness that is a cause of lower back pain.  Most of these medications can cause drowsiness, and it is not safe to drive or use dangerous machinery while taking them. ° °SEEK IMMEDIATE MEDICAL ATTENTION IF: °New numbness, tingling, weakness, or problem with the use of your arms or legs.  °Severe back pain not relieved with medications.  °Difficulty with or loss of control of your bowel or bladder control.    °Increasing pain in any areas of the body (such as chest or abdominal pain).  °Shortness of breath, dizziness or fainting.  °Nausea (feeling sick to your stomach), vomiting, fever, or sweats. ° °You will need to follow up with  Your primary healthcare provider in 1-2 weeks for reassessment.  °

## 2017-11-24 NOTE — ED Triage Notes (Signed)
Pt c/o of back pain x1 month after a fall. Pt was seen at urgent care and instructed to preform exercises and ibuprofen. Pt woke up today with right sided upper back pain. Denies trauma, but has been reaching up to grab items and felt the "muscle catch" when she was pulling up to get in the car. Active ROM in all extremities. Tried advil PTA, not helping much.

## 2017-12-03 ENCOUNTER — Encounter (HOSPITAL_COMMUNITY): Payer: Self-pay | Admitting: Emergency Medicine

## 2017-12-03 ENCOUNTER — Emergency Department (HOSPITAL_COMMUNITY): Payer: Medicare Other

## 2017-12-03 ENCOUNTER — Other Ambulatory Visit: Payer: Self-pay

## 2017-12-03 ENCOUNTER — Emergency Department (HOSPITAL_COMMUNITY)
Admission: EM | Admit: 2017-12-03 | Discharge: 2017-12-03 | Disposition: A | Discharge status: Home / Self Care | Payer: Medicare Other | Attending: Emergency Medicine | Admitting: Emergency Medicine

## 2017-12-03 DIAGNOSIS — Z87891 Personal history of nicotine dependence: Secondary | ICD-10-CM | POA: Insufficient documentation

## 2017-12-03 DIAGNOSIS — S299XXD Unspecified injury of thorax, subsequent encounter: Secondary | ICD-10-CM | POA: Diagnosis present

## 2017-12-03 DIAGNOSIS — S29019D Strain of muscle and tendon of unspecified wall of thorax, subsequent encounter: Secondary | ICD-10-CM | POA: Diagnosis not present

## 2017-12-03 DIAGNOSIS — W010XXD Fall on same level from slipping, tripping and stumbling without subsequent striking against object, subsequent encounter: Secondary | ICD-10-CM | POA: Diagnosis not present

## 2017-12-03 DIAGNOSIS — Z79899 Other long term (current) drug therapy: Secondary | ICD-10-CM | POA: Insufficient documentation

## 2017-12-03 MED ORDER — IBUPROFEN 600 MG PO TABS: 600.0000 mg | ORAL_TABLET | Freq: Four times a day (QID) | ORAL | 0 refills | Status: DC | PRN

## 2017-12-03 MED ORDER — IBUPROFEN 200 MG PO TABS
600.0000 mg | ORAL_TABLET | Freq: Once | ORAL | Status: AC
  Administered 2017-12-03: 600 mg via ORAL
  Filled 2017-12-03: qty 1

## 2017-12-03 NOTE — ED Notes (Signed)
Patient able to ambulate independently  

## 2017-12-03 NOTE — Discharge Instructions (Signed)
Take 600 mg of ibuprofen every 6 hours for pain control. Make sure to eat when you take this medication to avoid upsetting your stomach. Apply ice for 15-20 minutes up to 3-4 times a day to help with pain and swelling.    Perform stretches of the muscles in your back to help with your pain.  If you pain does not start to improve in the next 2 weeks, please follow up with primary care.  If you develop new or worsening symptoms including shortness of breath, chest pain, weakness, numbness, or if you have any fall or injury, please return to the emergency department for re-evaluation.

## 2017-12-03 NOTE — ED Provider Notes (Signed)
[facility] EMERGENCY DEPARTMENT Provider Note   CSN: [id number] Arrival date & time: 12/03/17  1514     History   Chief Complaint Chief Complaint  Patient presents with  . Back Pain    HPI Kayla Proctor is a 37 y.o. female with a history of anemia who presents to the emergency department with a chief complaint of back pain.  The patient endorses right sided thoracic back pain that began approximately 1 month ago when she slipped on a patch of ice and fell on her right side.  She characterizes the pain as pressure-like.  The pain radiates from her right thoracic back to under her right breast.  Pain is worse when she crosses her arms in front of her chest and leans back against a chair.  Pain is improved when she raises her arms up over her head.  The intensity of the pain has been gradually improving since onset.  She was initially seen by urgent care and was discharged without any prescriptions, but was told to perform exercises as it was thought to be musculoskeletal.  She was examined a second time in the emergency department on 3/26 and discharged with Flexeril and anti-inflammatories.  She reports that she has been compliant with home exercises, Flexeril, and anti-inflammatories with little improvement in her symptoms.  Last dose of ibuprofen was this morning.  She denies fever, chills, chest pain, cough, dyspnea, weakness, numbness, palpitations, nausea, vomiting, diarrhea, or abdominal pain.  No history of similar pain.   Patient reports that she performs a lot of bending and overhead lifting at her job.  The history is provided by the patient. No language interpreter was used.  Back Pain   Pertinent negatives include no chest pain, no fever, no numbness, no abdominal pain and no weakness.    Past Medical History:  Diagnosis Date  . Anemia     There are no active problems to display for this patient.   History reviewed. No pertinent surgical  history.   OB History   None      Home Medications    Prior to Admission medications   Medication Sig Start Date End Date Taking? Authorizing Provider  acetaminophen-codeine (TYLENOL #3) 300-30 MG per tablet Take 1-2 tablets by mouth every 6 (six) hours as needed for pain. 10/18/12   Hayden Rasmussen, NP  albuterol (PROVENTIL HFA;VENTOLIN HFA) 108 (90 BASE) MCG/ACT inhaler Inhale 1-2 puffs into the lungs every 6 (six) hours as needed for wheezing. 07/13/12   Jimmie Molly, MD  cyclobenzaprine (FLEXERIL) 10 MG tablet Take 1 tablet (10 mg total) by mouth 3 (three) times daily as needed for muscle spasms. 11/24/17   Street, [place], PA-C  ferrous sulfate 325 (65 FE) MG tablet Take 325 mg by mouth daily with breakfast.    [provider]  ibuprofen (ADVIL,MOTRIN) 600 MG tablet Take 1 tablet (600 mg total) by mouth every 6 (six) hours as needed. 12/03/17   McDonald, Mia A, PA-C  loratadine (CLARITIN) 10 MG tablet Take 1 tablet (10 mg total) by mouth daily. 05/10/12 05/10/13  Elpidio Anis, PA-C  naproxen (NAPROSYN) 500 MG tablet Take 1 tablet (500 mg total) by mouth 2 (two) times daily with a meal. 11/24/17   Street, [place], PA-C  omeprazole (PRILOSEC) 20 MG capsule Take 1 capsule (20 mg total) by mouth daily. 09/17/12   Palumbo, April, MD  penicillin v potassium (VEETID) 500 MG tablet Take 1 tablet (500 mg total) by mouth 3 (  three) times daily. 09/17/12   Palumbo, April, MD  Phenylephrine-Chlorphen-DM 06-05-11.5 MG/5ML LIQD Take 5 mLs by mouth every 4 (four) hours as needed. 10/18/12   Hayden RasmussenMabe, David, NP    Family History No family history on file.  Social History Social History   Tobacco Use  . Smoking status: Former Smoker    Types: Cigarettes    Last attempt to quit: 05/03/2011    Years since quitting: 6.5  . Smokeless tobacco: Former Engineer, waterUser  Substance Use Topics  . Alcohol use: No  . Drug use: No     Allergies   Patient has no known allergies.   Review of Systems Review of Systems   Constitutional: Negative for activity change, chills and fever.  HENT: Negative for congestion.   Respiratory: Negative for cough and shortness of breath.   Cardiovascular: Negative for chest pain.  Gastrointestinal: Negative for abdominal pain, diarrhea, nausea and vomiting.  Musculoskeletal: Positive for arthralgias, back pain and myalgias. Negative for neck pain and neck stiffness.  Skin: Negative for rash.  Neurological: Negative for dizziness, weakness and numbness.     Physical Exam Updated Vital Signs BP 133/76 (BP Location: Right Arm)   Pulse (!) 114   Temp 98 F (36.7 C) (Oral)   Resp 16   Ht 5\' 5"  (1.651 m)   Wt 93.4 kg (206 lb)   LMP 11/13/2017 (Exact Date)   SpO2 100%   BMI 34.28 kg/m   Physical Exam  Constitutional: No distress.  HENT:  Head: Normocephalic.  Eyes: Conjunctivae are normal.  Neck: Normal range of motion. Neck supple.  Cardiovascular: Normal rate, regular rhythm, normal heart sounds and intact distal pulses. Exam reveals no gallop and no friction rub.  No murmur heard. Pulmonary/Chest: Effort normal and breath sounds normal. No stridor. No respiratory distress. She has no wheezes. She has no rales. She exhibits no tenderness.  Abdominal: Soft. She exhibits no distension. There is no tenderness.  No CVA tenderness bilaterally.  Musculoskeletal: She exhibits tenderness. She exhibits no edema or deformity.  Reproducible tenderness to palpation to the paraspinal muscles of the right thoracic back.  No midline tenderness to the spinous processes of the cervical, thoracic, or lumbar spine.  Musculature of the cervical and lumbar regions are unremarkable.  No left-sided thoracic tenderness.  The patient has a large amount of subcutaneous tissue in the region where she is tender.  No palpable crepitus or step-offs; however, exam is limited secondary to body habitus.  No overlying erythema, edema, warmth, or ecchymosis.  Neurological: She is alert.   Skin: Skin is warm. Capillary refill takes less than 2 seconds. No rash noted. No erythema. No pallor.  Psychiatric: Her behavior is normal.  Nursing note and vitals reviewed.    ED Treatments / Results  Labs (all labs ordered are listed, but only abnormal results are displayed) Labs Reviewed - No data to display  EKG None  Radiology Dg Chest 2 View  Result Date: 12/03/2017 CLINICAL DATA:  Right posterior rib pain EXAM: CHEST - 2 VIEW COMPARISON:  None. FINDINGS: The heart size and mediastinal contours are within normal limits. Both lungs are clear. The visualized skeletal structures are unremarkable. IMPRESSION: No active cardiopulmonary disease. Electronically Signed   By: Jasmine PangKim  Fujinaga M.D.   On: 12/03/2017 18:32    Procedures Procedures (including critical care time)  Medications Ordered in ED Medications  ibuprofen (ADVIL,MOTRIN) tablet 600 mg (600 mg Oral Given 12/03/17 1811)     Initial Impression / Assessment  and Plan / ED Course  I have reviewed the triage vital signs and the nursing notes.  Pertinent labs & imaging results that were available during my care of the patient were reviewed by me and considered in my medical decision making (see chart for details).     37 year old female with a history of anemia presenting with right sided thoracic back pain x1 month.  Examination is somewhat limited due to body habitus and a large amount of subcutaneous tissue in the area of the right thoracic musculature, but she does have reproducible musculoskeletal tenderness to palpation.  No obvious crepitus or step-offs.  The patient has previously been evaluated for this 2 times, but imaging has not been performed. CXR is negative.  Will discharge the patient to home with anti-inflammatories, ice, stretches, and PCP follow-up.  Strict return precautions given.  She is hemodynamically stable and in no acute distress.  The patient is safe for discharge home at this time.  Final  Clinical Impressions(s) / ED Diagnoses   Final diagnoses:  Thoracic myofascial strain, subsequent encounter    ED Discharge Orders        Ordered    ibuprofen (ADVIL,MOTRIN) 600 MG tablet  Every 6 hours PRN     12/03/17 1855       McDonald, Mia A, PA-C 12/03/17 1901    Rolan Bucco, MD 12/03/17 2349

## 2017-12-03 NOTE — ED Triage Notes (Signed)
Pt to ED with c/o upper right back pain x's 1 month after a fall.  Pt requesting an x-ray and something for her allergies.  Pt st's she is not having back pain but "it's sore"

## 2017-12-03 NOTE — ED Notes (Signed)
Patient not in room when approached for medication administration.  Will return

## 2018-01-14 ENCOUNTER — Ambulatory Visit: Payer: Medicare Other | Attending: Family Medicine

## 2018-10-16 ENCOUNTER — Other Ambulatory Visit: Payer: Self-pay

## 2018-10-16 ENCOUNTER — Ambulatory Visit
Admission: EM | Admit: 2018-10-16 | Discharge: 2018-10-16 | Disposition: A | Discharge status: Home / Self Care | Payer: Medicare Other

## 2018-10-16 ENCOUNTER — Encounter: Payer: Self-pay | Admitting: Family Medicine

## 2018-10-16 DIAGNOSIS — M4120 Other idiopathic scoliosis, site unspecified: Secondary | ICD-10-CM

## 2018-10-16 DIAGNOSIS — G8929 Other chronic pain: Secondary | ICD-10-CM

## 2018-10-16 DIAGNOSIS — M549 Dorsalgia, unspecified: Principal | ICD-10-CM

## 2018-10-16 NOTE — ED Triage Notes (Signed)
Per pt she started have back pain in the right upper area  And moves up toward her neck on the right side. Pt stated when she lays down it feels like pressure in her upper back area.

## 2018-10-16 NOTE — Discharge Instructions (Addendum)
Three exercises every day:  Plank  -  horizontal strengthening  Rotation when on hands and knees  Child's pose and arching back

## 2018-10-16 NOTE — ED Provider Notes (Signed)
[facility]    CSN: [id number] Arrival date & time: 10/16/18  0903     History   Chief Complaint Chief Complaint  Patient presents with  . Back Pain    HPI Kayla Proctor is a 38 y.o. female.   This is a 38 year old woman who comes in with her daughter today to [facility] the urgent care for the first time.  She has a history of recurrent back pains that occur in different places of her back.  She has been seen in the emergency room and elsewhere for evaluation of this and usually takes some Advil and the pain eventually goes away.  Patient is a Press photographer.  Today patient says that she has some discomfort in her right neck when she turns her head far to the right.  She thinks he was sleeping in an uncomfortable position for a while and this precipitated the pain.  She also has some discomfort in her right low back.  She is able to perform all ADLs without difficulty.     Past Medical History:  Diagnosis Date  . Anemia     There are no active problems to display for this patient.   History reviewed. No pertinent surgical history.  OB History   No obstetric history on file.      Home Medications    Prior to Admission medications   Medication Sig Start Date End Date Taking? Authorizing Provider  acetaminophen-codeine (TYLENOL #3) 300-30 MG per tablet Take 1-2 tablets by mouth every 6 (six) hours as needed for pain. 10/18/12   Hayden Rasmussen, NP  albuterol (PROVENTIL HFA;VENTOLIN HFA) 108 (90 BASE) MCG/ACT inhaler Inhale 1-2 puffs into the lungs every 6 (six) hours as needed for wheezing. 07/13/12   Jimmie Molly, MD  cyclobenzaprine (FLEXERIL) 10 MG tablet Take 1 tablet (10 mg total) by mouth 3 (three) times daily as needed for muscle spasms. 11/24/17   Street, Guadalupe Guerra, PA-C  ferrous sulfate 325 (65 FE) MG tablet Take 325 mg by mouth daily with breakfast.    [provider]  ibuprofen (ADVIL,MOTRIN) 600 MG tablet Take 1 tablet (600 mg total) by mouth  every 6 (six) hours as needed. 12/03/17   McDonald, Mia A, PA-C  loratadine (CLARITIN) 10 MG tablet Take 1 tablet (10 mg total) by mouth daily. 05/10/12 05/10/13  Elpidio Anis, PA-C  naproxen (NAPROSYN) 500 MG tablet Take 1 tablet (500 mg total) by mouth 2 (two) times daily with a meal. 11/24/17   Street, [place], PA-C  omeprazole (PRILOSEC) 20 MG capsule Take 1 capsule (20 mg total) by mouth daily. 09/17/12   Palumbo, April, MD  penicillin v potassium (VEETID) 500 MG tablet Take 1 tablet (500 mg total) by mouth 3 (three) times daily. 09/17/12   Palumbo, April, MD  Phenylephrine-Chlorphen-DM 06-05-11.5 MG/5ML LIQD Take 5 mLs by mouth every 4 (four) hours as needed. 10/18/12   Hayden Rasmussen, NP    Family History No family history on file.  Social History Social History   Tobacco Use  . Smoking status: Former Smoker    Types: Cigarettes    Last attempt to quit: 05/03/2011    Years since quitting: 7.4  . Smokeless tobacco: Former Engineer, water Use Topics  . Alcohol use: No  . Drug use: No     Allergies   Patient has no known allergies.   Review of Systems Review of Systems   Physical Exam Triage Vital Signs ED Triage Vitals [10/16/18 0916]  Enc Vitals Group     BP 119/72     Pulse Rate 95     Resp 16     Temp (!) 97.2 F (36.2 C)     Temp Source Oral     SpO2 98 %     Weight 200 lb (90.7 kg)     Height 5\' 5"  (1.651 m)     Head Circumference      Peak Flow      Pain Score 8     Pain Loc      Pain Edu?      Excl. in [place]?    No data found.  Updated Vital Signs BP 119/72 (BP Location: Left Arm)   Pulse 95   Temp (!) 97.2 F (36.2 C) (Oral)   Resp 16   Ht 5\' 5"  (1.651 m)   Wt 90.7 kg   LMP 10/11/2018 (Exact Date)   SpO2 98%   BMI 33.28 kg/m    Physical Exam Vitals signs and nursing note reviewed.  Constitutional:      Appearance: Normal appearance. She is obese.  HENT:     Head: Normocephalic.     Mouth/Throat:     Pharynx: Oropharynx is clear.  Eyes:      Conjunctiva/sclera: Conjunctivae normal.  Neck:     Musculoskeletal: Normal range of motion and neck supple. No muscular tenderness.  Cardiovascular:     Rate and Rhythm: Normal rate and regular rhythm.     Heart sounds: Normal heart sounds.  Pulmonary:     Effort: Pulmonary effort is normal.     Breath sounds: Normal breath sounds.  Musculoskeletal: Normal range of motion.     Comments: Patient has mild kyphoscoliosis with left scapular prominence when she bends over and convexity to the right in the thoracic region.  Skin:    General: Skin is warm and dry.  Neurological:     General: No focal deficit present.     Mental Status: She is alert.  Psychiatric:        Mood and Affect: Mood normal.    We discussed various ways of movement and exercises that she can do every day to keep these minor aches and pains from recurring.  UC Treatments / Results  Labs (all labs ordered are listed, but only abnormal results are displayed) Labs Reviewed - No data to display  EKG None  Radiology No results found.  Procedures Procedures (including critical care time)  Medications Ordered in UC Medications - No data to display  Initial Impression / Assessment and Plan / UC Course  I have reviewed the triage vital signs and the nursing notes.  Pertinent labs & imaging results that were available during my care of the patient were reviewed by me and considered in my medical decision making (see chart for details).    Final Clinical Impressions(s) / UC Diagnoses   Final diagnoses:  Chronic bilateral back pain, unspecified back location  Scoliosis (and kyphoscoliosis), idiopathic     Discharge Instructions     Three exercises every day:  Plank  -  horizontal strengthening  Rotation when on hands and knees  Child's pose and arching back   ED Prescriptions    None     Controlled Substance Prescriptions [place] Controlled Substance Registry consulted? Not Applicable     Elvina Sidle, MD 10/16/18 [phone]

## 2018-11-18 ENCOUNTER — Ambulatory Visit: Payer: Medicare Other | Attending: Physician Assistant | Admitting: Physical Therapy

## 2018-11-18 ENCOUNTER — Other Ambulatory Visit: Payer: Self-pay

## 2018-11-18 ENCOUNTER — Encounter: Payer: Self-pay | Admitting: Physical Therapy

## 2018-11-18 DIAGNOSIS — M62830 Muscle spasm of back: Secondary | ICD-10-CM | POA: Insufficient documentation

## 2018-11-18 DIAGNOSIS — M545 Low back pain, unspecified: Secondary | ICD-10-CM

## 2018-11-18 DIAGNOSIS — M542 Cervicalgia: Secondary | ICD-10-CM | POA: Diagnosis not present

## 2018-11-18 DIAGNOSIS — M546 Pain in thoracic spine: Secondary | ICD-10-CM | POA: Insufficient documentation

## 2018-11-18 NOTE — Therapy (Signed)
[facility] [address] [place], [place], [address] Phone: [phone]   Fax:  [phone]  Physical Therapy Evaluation  Patient Details  Name: Kayla Proctor MRN: [id number] Date of Birth: 1981/01/28 Referring Provider (PT): Rick Duff, [place]   Encounter Date: 11/18/2018  PT End of Session - 11/18/18 1557    Visit Number  1    Date for PT Re-Evaluation  01/18/19    PT Start Time  1530    PT Stop Time  1610    PT Time Calculation (min)  40 min    Activity Tolerance  Patient tolerated treatment well    Behavior During Therapy  [facility] for tasks assessed/performed       Past Medical History:  Diagnosis Date  . Anemia     History reviewed. No pertinent surgical history.  There were no vitals filed for this visit.   Subjective Assessment - 11/18/18 1531    Subjective  Patient reports that she has had back pain for about a year.  X-rays show "curvature"    Limitations  Lifting;Standing    How long can you stand comfortably?  5 minutes    Patient Stated Goals  have less pain, do more    Currently in Pain?  Yes    Pain Score  5     Pain Orientation  Mid;Lower    Pain Descriptors / Indicators  Aching;Spasm    Pain Type  Acute pain    Pain Onset  More than a month ago    Pain Frequency  Constant    Aggravating Factors   standing bending, lifting pain up to 9/10    Pain Relieving Factors  rest, pain medication pain can be 0/10    Effect of Pain on Daily Activities  limites everything         [facility] PT Assessment - 11/18/18 0001      Assessment   Medical Diagnosis  LBP, mid pain    Referring Provider (PT)  Rick Duff, PA    Onset Date/Surgical Date  10/20/18    Prior Therapy  no      Precautions   Precautions  None      Balance Screen   Has the patient fallen in the past 6 months  No    Has the patient had a decrease in activity level because of a fear of falling?   No    Is the patient reluctant  to leave their home because of a fear of falling?   No      Home Environment   Additional Comments  does housework, cooking      Prior Function   Level of Independence  Independent    Vocation  On disability    Leisure  no exercise      Posture/Postural Control   Posture Comments  fwd head, rounded shoulders, slouched posture      ROM / Strength   AROM / PROM / Strength  AROM;Strength      AROM   Overall AROM Comments  Lumbar ROM decreased 50% with some pain, cervical ROM decreased 25% with some pain      Strength   Overall Strength Comments  4-/5 with some pain, in the neck and the back      Flexibility   Soft Tissue Assessment /Muscle Length  yes    Hamstrings  tight    ITB  tight    Piriformis  tight      Palpation   Palpation comment  very tight with spasms and tenderness in neck, thoracic and lumbar area      Ambulation/Gait   Gait Comments  very sore with all activities, gaurded motions                Objective measurements completed on examination: See above findings.              PT Education - 11/18/18 1555    Education Details  cervical and thoracic retraction, upper trap stretch, side stretch    Person(s) Educated  Patient    Methods  Explanation;Demonstration;Verbal cues;Tactile cues    Comprehension  Verbalized understanding       PT Short Term Goals - 11/18/18 1610      PT SHORT TERM GOAL #1   Title  independent with initial HEP    Time  2    Period  Weeks    Status  New        PT Long Term Goals - 11/18/18 1610      PT LONG TERM GOAL #1   Title  understand posture and body mechanics    Time  12    Period  Weeks    Status  New      PT LONG TERM GOAL #2   Title  decrease pain 50%    Time  12    Period  Weeks    Status  New      PT LONG TERM GOAL #3   Title  increase ROM 50%    Time  12    Period  Weeks    Status  New      PT LONG TERM GOAL #4   Title  stand 15 minutes without pain > 5 /10    Time  12     Period  Weeks    Status  New             Plan - 11/18/18 1600    Clinical Impression Statement  Patient has low, mid back pain, some neck pain.  She reports this has occurred over a bout a year.  She has significant spasms in the neck, rhomboids and the low back.  She has difficulty with standing and bending, ROM is limited    Personal Factors and Comorbidities  Education;Social Background    Examination-Activity Limitations  Stand;Sit;Bend;Lift    Examination-Participation Restrictions  Cleaning;Laundry    Stability/Clinical Decision Making  Stable/Uncomplicated    Clinical Decision Making  Low    Rehab Potential  Good    PT Frequency  2x / week    PT Duration  8 weeks    PT Treatment/Interventions  ADLs/Self Care Home Management;Electrical Stimulation;Moist Heat;Traction;Ultrasound;Therapeutic exercise;Therapeutic activities;Functional mobility training;Balance training;Neuromuscular re-education;Patient/family education;Manual techniques;Dry needling    PT Next Visit Plan  We will be closing for two weeks due to covid-19, will try to see her in future to start exercise    Consulted and Agree with Plan of Care  Patient       Patient will benefit from skilled therapeutic intervention in order to improve the following deficits and impairments:  Increased muscle spasms, Decreased range of motion, Decreased strength, Impaired flexibility, Postural dysfunction, Pain, Improper body mechanics  Visit Diagnosis: Acute bilateral low back pain without sciatica - Plan: PT plan of care cert/re-cert  Pain in thoracic spine - Plan: PT plan of care cert/re-cert  Cervicalgia - Plan: PT plan of care  cert/re-cert  Muscle spasm of back - Plan: PT plan of care cert/re-cert     Problem List There are no active problems to display for this patient.   Jearld Lesch., PT 11/18/2018, 4:13 PM  [facility] [address] [place], [place], [address] Phone: [phone]   Fax:  [phone]  Name: TATIANA COURTER MRN: [id number] Date of Birth: [birth year]

## 2018-12-07 ENCOUNTER — Ambulatory Visit: Payer: Medicare Other | Admitting: Physical Therapy

## 2018-12-09 ENCOUNTER — Ambulatory Visit: Payer: Medicare Other | Admitting: Physical Therapy

## 2018-12-09 ENCOUNTER — Encounter: Payer: Medicare Other | Admitting: Physical Therapy

## 2018-12-30 ENCOUNTER — Ambulatory Visit: Payer: Medicare Other | Attending: Family Medicine | Admitting: Physical Therapy

## 2019-05-20 ENCOUNTER — Other Ambulatory Visit: Payer: Self-pay

## 2019-05-20 ENCOUNTER — Ambulatory Visit
Admission: EM | Admit: 2019-05-20 | Discharge: 2019-05-20 | Disposition: A | Discharge status: Home / Self Care | Payer: Medicare Other | Attending: Physician Assistant | Admitting: Physician Assistant

## 2019-05-20 DIAGNOSIS — M546 Pain in thoracic spine: Secondary | ICD-10-CM

## 2019-05-20 DIAGNOSIS — M545 Low back pain, unspecified: Secondary | ICD-10-CM

## 2019-05-20 MED ORDER — MELOXICAM 7.5 MG PO TABS: 7.5000 mg | ORAL_TABLET | Freq: Every day | ORAL | 0 refills | Status: AC

## 2019-05-20 MED ORDER — METHOCARBAMOL 500 MG PO TABS: 500.0000 mg | ORAL_TABLET | Freq: Two times a day (BID) | ORAL | 0 refills | Status: AC

## 2019-05-20 NOTE — ED Provider Notes (Signed)
[facility]    CSN: [id number] Arrival date & time: 05/20/19  1228      History   Chief Complaint Chief Complaint  Patient presents with  . Back Pain    HPI Kayla Proctor is a 38 y.o. female.   38 year old female comes in for 2-day history of right thoracic and lumbar pain.  Patient states this has becoming more frequent as work requires heavy lifting.  Pain is intermittent, exacerbated by movement.  Denies obvious injury/trauma.  Denies swelling of the joints, erythema, warmth.  Denies numbness, tingling.  States thoracic pain can occasionally radiate down the back.  Denies saddle anesthesia, loss of bladder or bowel control.  Has been taking Aleve without relief.     Past Medical History:  Diagnosis Date  . Anemia     There are no active problems to display for this patient.   History reviewed. No pertinent surgical history.  OB History   No obstetric history on file.      Home Medications    Prior to Admission medications   Medication Sig Start Date End Date Taking? Authorizing Provider  ferrous sulfate 325 (65 FE) MG tablet Take 325 mg by mouth daily with breakfast.    [provider]  meloxicam (MOBIC) 7.5 MG tablet Take 1 tablet (7.5 mg total) by mouth daily. 05/20/19   Tasia Catchings, Mckenzee Beem V, PA-C  methocarbamol (ROBAXIN) 500 MG tablet Take 1 tablet (500 mg total) by mouth 2 (two) times daily. 05/20/19   Tasia Catchings, Johaan Ryser V, PA-C  albuterol (PROVENTIL HFA;VENTOLIN HFA) 108 (90 BASE) MCG/ACT inhaler Inhale 1-2 puffs into the lungs every 6 (six) hours as needed for wheezing. 07/13/12 05/20/19  Rosana Hoes, MD  loratadine (CLARITIN) 10 MG tablet Take 1 tablet (10 mg total) by mouth daily. 05/10/12 05/20/19  Charlann Lange, PA-C  omeprazole (PRILOSEC) 20 MG capsule Take 1 capsule (20 mg total) by mouth daily. 09/17/12 05/20/19  Palumbo, April, MD    Family History No family history on file.  Social History Social History   Tobacco Use  . Smoking status: Former  Smoker    Types: Cigarettes    Quit date: 05/03/2011    Years since quitting: 8.0  . Smokeless tobacco: Former Network engineer Use Topics  . Alcohol use: No  . Drug use: No     Allergies   Patient has no known allergies.   Review of Systems Review of Systems  Reason unable to perform ROS: See HPI as above.     Physical Exam Triage Vital Signs ED Triage Vitals  Enc Vitals Group     BP 05/20/19 1242 133/79     Pulse Rate 05/20/19 1242 90     Resp 05/20/19 1242 18     Temp 05/20/19 1242 98.4 F (36.9 C)     Temp Source 05/20/19 1242 Oral     SpO2 05/20/19 1242 98 %     Weight --      Height --      Head Circumference --      Peak Flow --      Pain Score 05/20/19 1243 7     Pain Loc --      Pain Edu? --      Excl. in [place]? --    No data found.  Updated Vital Signs BP 133/79 (BP Location: Left Arm)   Pulse 90   Temp 98.4 F (36.9 C) (Oral)   Resp 18   LMP 04/29/2019  SpO2 98%   Physical Exam Constitutional:      General: She is not in acute distress.    Appearance: She is well-developed. She is not diaphoretic.  HENT:     Head: Normocephalic and atraumatic.  Eyes:     Conjunctiva/sclera: Conjunctivae normal.     Pupils: Pupils are equal, round, and reactive to light.  Cardiovascular:     Rate and Rhythm: Normal rate and regular rhythm.     Heart sounds: Normal heart sounds. No murmur. No friction rub. No gallop.   Pulmonary:     Effort: Pulmonary effort is normal. No accessory muscle usage or respiratory distress.     Breath sounds: Normal breath sounds. No stridor. No decreased breath sounds, wheezing, rhonchi or rales.  Musculoskeletal:     Comments: No tenderness on palpation of the spinous processes.  Tenderness to palpation of right thoracic and lumbar region.  Full range of motion neck, shoulder, back, hips. Strength normal and equal bilaterally. Sensation intact and equal bilaterally.  Negative straight leg raise.  Radial pulses 2+ and equal  bilaterally. Capillary refill less than 2 seconds.   Skin:    General: Skin is warm and dry.  Neurological:     Mental Status: She is alert and oriented to person, place, and time.     UC Treatments / Results  Labs (all labs ordered are listed, but only abnormal results are displayed) Labs Reviewed - No data to display  EKG   Radiology No results found.  Procedures Procedures (including critical care time)  Medications Ordered in UC Medications - No data to display  Initial Impression / Assessment and Plan / UC Course  I have reviewed the triage vital signs and the nursing notes.  Pertinent labs & imaging results that were available during my care of the patient were reviewed by me and considered in my medical decision making (see chart for details).    Start NSAID as directed for pain and inflammation. Muscle relaxant as needed. Ice/heat compresses. Discussed with patient strain can take up to 3-4 weeks to resolve, but should be getting better each week. Return precautions given.   Final Clinical Impressions(s) / UC Diagnoses   Final diagnoses:  Acute right-sided low back pain without sciatica  Acute right-sided thoracic back pain    ED Prescriptions    Medication Sig Dispense Auth. Provider   meloxicam (MOBIC) 7.5 MG tablet Take 1 tablet (7.5 mg total) by mouth daily. 15 tablet Lieutenant Abarca V, PA-C   methocarbamol (ROBAXIN) 500 MG tablet Take 1 tablet (500 mg total) by mouth 2 (two) times daily. 20 tablet Belinda FisherYu, Hattie Pine V, PA-C     PDMP not reviewed this encounter.   Belinda FisherYu, Sherell Christoffel V, PA-C 05/20/19 1452

## 2019-05-20 NOTE — Discharge Instructions (Addendum)
Start Mobic. Do not take ibuprofen (motrin/advil)/ naproxen (aleve) while on mobic.Robaxin as needed, this can make you drowsy, so do not take if you are going to drive, operate heavy machinery, or make important decisions. Ice/heat compresses as needed. This can take up to 3-4 weeks to completely resolve, but you should be feeling better each week. Follow up with PCP if symptoms worsen, changes for reevaluation. If experience numbness/tingling of the inner thighs, loss of bladder or bowel control, go to the emergency department for evaluation.  ° °

## 2019-05-20 NOTE — ED Triage Notes (Signed)
Pt c/o rt upper/mid to lower back pain x2 days after lifting heavy furniture

## 2019-05-24 IMAGING — DX DG CHEST 2V
3 series · 3 of 3 positions shown · non-contrast
Comparison: None.

CLINICAL DATA: Right posterior rib pain

EXAM:
CHEST - 2 VIEW

[chest pa (1 of 2)]
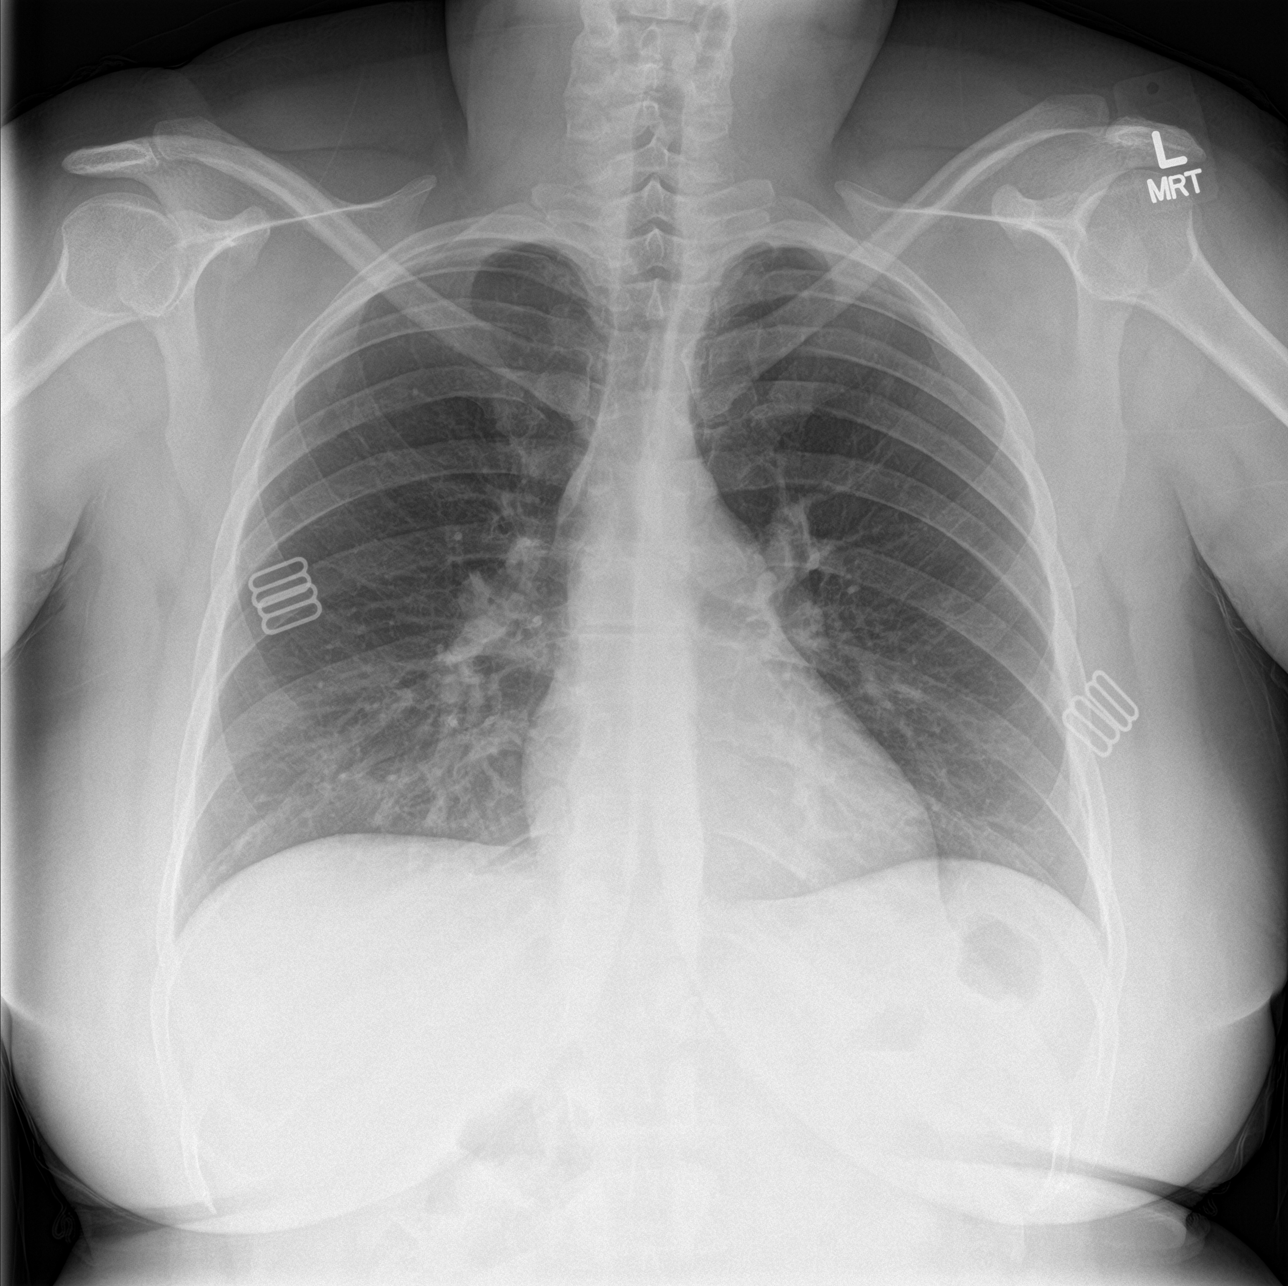

[chest lat]
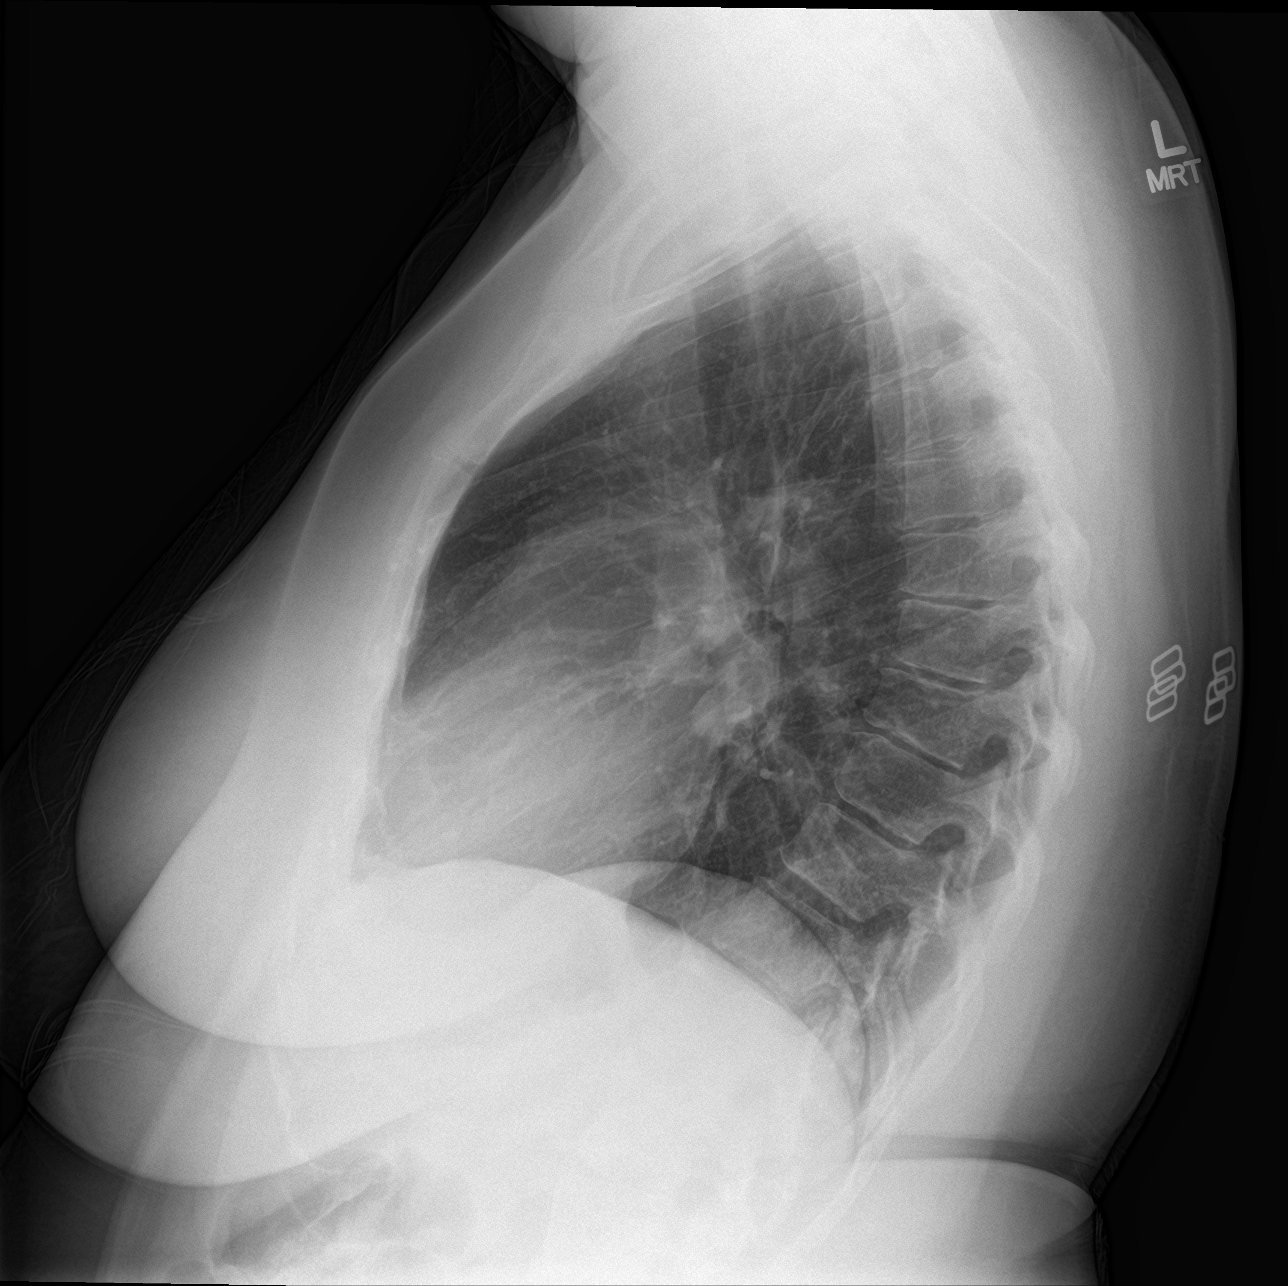

[chest pa (2 of 2)]
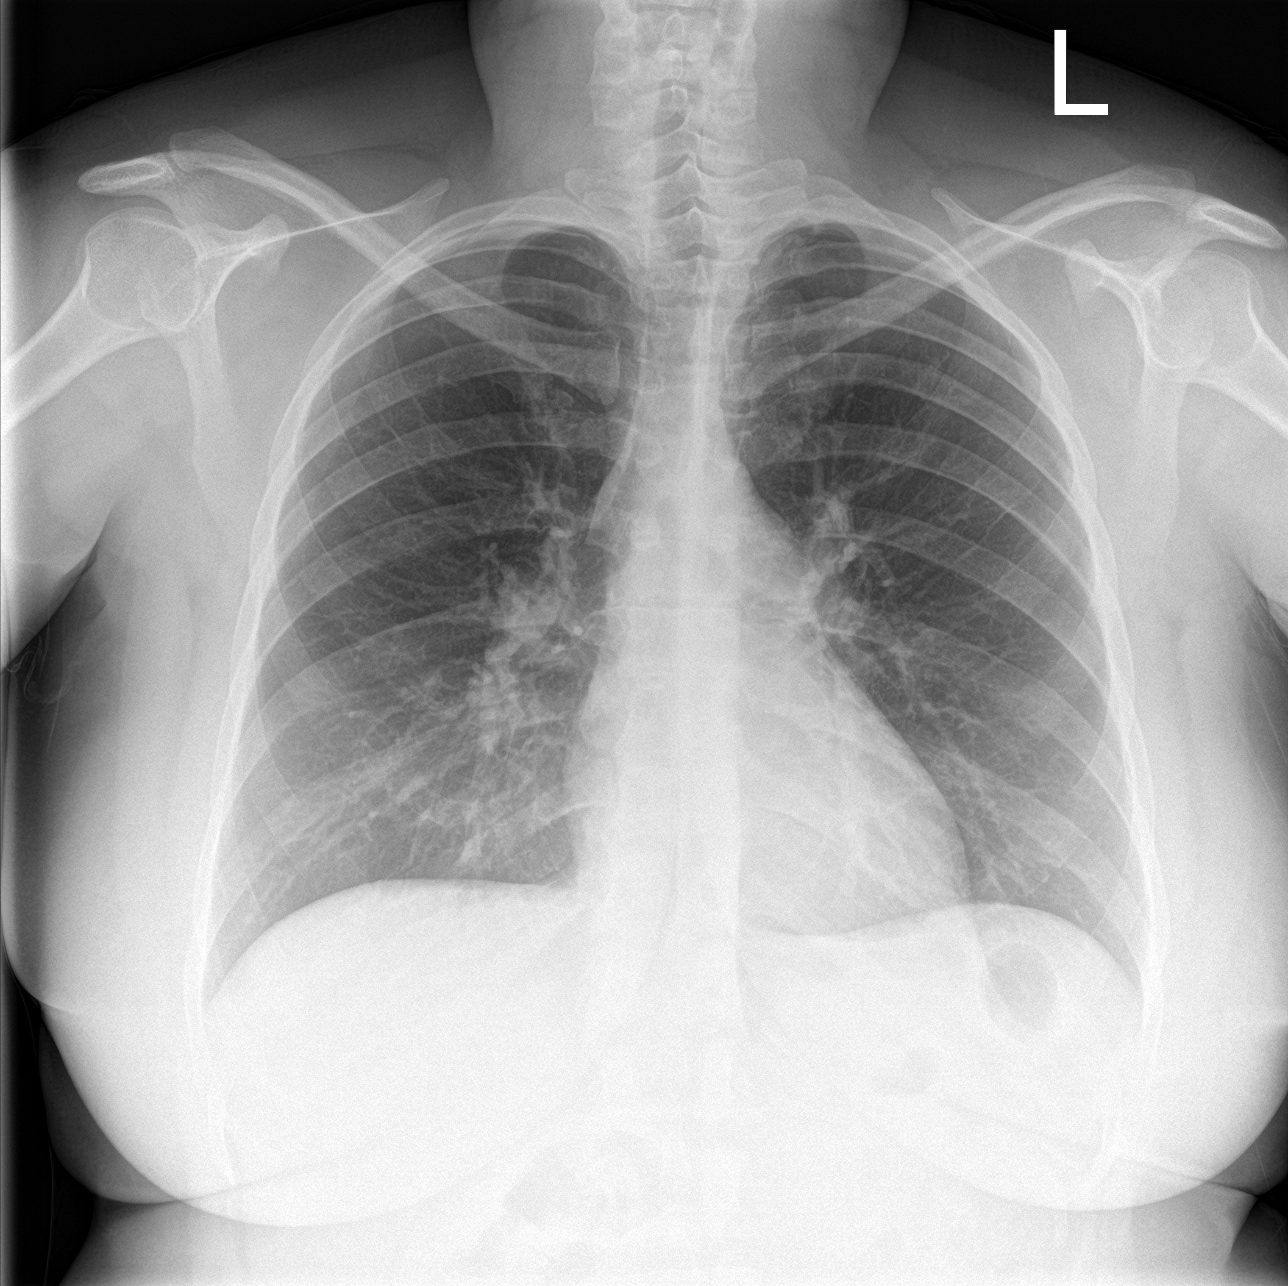

[3 of 3 positions shown; findings below may reference images not displayed]

FINDINGS: The heart size and mediastinal contours are within normal limits.
Both lungs are clear. The visualized skeletal structures are
unremarkable.
IMPRESSION: No active cardiopulmonary disease.

## 2020-02-15 ENCOUNTER — Other Ambulatory Visit: Payer: Self-pay

## 2020-02-15 ENCOUNTER — Ambulatory Visit: Payer: Medicare Other | Attending: Physician Assistant | Admitting: Physical Therapy

## 2020-02-15 ENCOUNTER — Encounter: Payer: Self-pay | Admitting: Physical Therapy

## 2020-02-15 DIAGNOSIS — M546 Pain in thoracic spine: Secondary | ICD-10-CM | POA: Diagnosis present

## 2020-02-15 DIAGNOSIS — M545 Low back pain, unspecified: Secondary | ICD-10-CM

## 2020-02-15 DIAGNOSIS — M25511 Pain in right shoulder: Secondary | ICD-10-CM | POA: Diagnosis present

## 2020-02-15 DIAGNOSIS — M62830 Muscle spasm of back: Secondary | ICD-10-CM

## 2020-02-15 DIAGNOSIS — G8929 Other chronic pain: Secondary | ICD-10-CM

## 2020-02-15 DIAGNOSIS — M542 Cervicalgia: Secondary | ICD-10-CM

## 2020-02-15 NOTE — Therapy (Signed)
[facility] [place] [address] [place], [place], [address] Phone: [phone]   Fax:  [phone]  Physical Therapy Evaluation  Patient Details  Name: Kayla Proctor MRN: [id number] Date of Birth: 1980/09/15 Referring Provider (PT): Tomma Lightning   Encounter Date: 02/15/2020   PT End of Session - 02/15/20 1340    Visit Number 1    Date for PT Re-Evaluation 04/16/20    PT Start Time 1306    PT Stop Time 1345    PT Time Calculation (min) 39 min    Activity Tolerance Patient tolerated treatment well    Behavior During Therapy [facility] for tasks assessed/performed           Past Medical History:  Diagnosis Date  . Anemia     History reviewed. No pertinent surgical history.  There were no vitals filed for this visit.    Subjective Assessment - 02/15/20 1307    Subjective Pt reports a fall in April 2020 and was having R shoulder, neck pain, and low back pain afterwards. She came into [facility] for an eval just before COVID and reports was not able to return after COVID started getting bad. Pt reports that she does a lot of standing, lifting, cooking, and cleaning and is having trouble with pain during these activities.    Limitations Lifting;Standing    How long can you stand comfortably? 10 minutes    Patient Stated Goals have less pain, do more    Currently in Pain? No/denies              [facility] PT Assessment - 02/15/20 0001      Assessment   Medical Diagnosis R shoulder/thoracic pain and LBP    Referring Provider (PT) Tomma Lightning    Onset Date/Surgical Date 10/20/18    Hand Dominance Left    Prior Therapy Yes      Precautions   Precautions None      Balance Screen   Has the patient fallen in the past 6 months No    Has the patient had a decrease in activity level because of a fear of falling?  No    Is the patient reluctant to leave their home because of a fear of falling?  No      Home Environment    Additional Comments does housework, cooking      Prior Function   Level of Independence Independent    Vocation On disability    Leisure walking      Sensation   Light Touch Appears Intact      Posture/Postural Control   Posture Comments fwd head, rounded shoulders, slouched posture      AROM   Overall AROM Comments lumbar flexion painful and 25% limited, R shoulder pain with IR; ROM otherwise WFL      Strength   Overall Strength Comments UE ROM WFL, LE ROM 4+/5 except hip ext 4-/5      Flexibility   Soft Tissue Assessment /Muscle Length yes    Hamstrings limited; reported cramp/spasm in quad with SLR    Quadriceps very limited and painful; reported strong muscle cramp with passive ROM of quad    Piriformis limited      Palpation   Palpation comment ttp lumbar spine and R shoulder      Transfers   Five time sit to stand comments  [facility]  Objective measurements completed on examination: See above findings.       [facility] PT Treatment/Exercise - 02/15/20 0001      Exercises   Exercises Shoulder;Lumbar      Lumbar Exercises: Stretches   Active Hamstring Stretch Right;Left;1 rep;60 seconds    Quad Stretch Right;Left;1 rep;60 seconds      Lumbar Exercises: Aerobic   Nustep L3 x 6 min      Lumbar Exercises: Standing   Scapular Retraction Both;10 reps                  PT Education - 02/15/20 1340    Education Details Pt educated on POC and HEP    Person(s) Educated Patient    Methods Explanation;Demonstration;Handout    Comprehension Verbalized understanding;Returned demonstration            PT Short Term Goals - 02/15/20 1349      PT SHORT TERM GOAL #1   Title independent with initial HEP    Time 2    Period Weeks    Status New    Target Date 02/29/20             PT Long Term Goals - 02/15/20 1349      PT LONG TERM GOAL #1   Title understand posture and body mechanics    Time 8    Period Weeks     Status New    Target Date 04/11/20      PT LONG TERM GOAL #2   Title decrease pain 50%    Time 8    Period Weeks    Status New    Target Date 04/11/20      PT LONG TERM GOAL #3   Title increase ROM 50%    Time 8    Period Weeks    Status New    Target Date 04/11/20      PT LONG TERM GOAL #4   Title stand 15 minutes without pain    Time 8    Period Weeks    Status New                  Plan - 02/15/20 1341    Clinical Impression Statement Pt reports to clinic with chronic LBP, thoracic pain, and R shoulder/neck pain present since a fall last year. Pt initially was evaled at [facility] but discontinued PT after the start of COVID. The pt's lumbar ROM, soreness/tenderness, and overall function seem to have improved since the last eval; however, she still presents with some functional impairments. Pt demonstrates decreased and painful lumbar flexion, tender to palpation R shoulder/lumbar paraspinals, spasm of quad, weakness of hip extension, and decreased LE flexibility. Pt also demonstrates some pain with shoulder IR. Pt would like to decrease pain with lifting and household ADLs. Pt would benefit from skilled PT to address the above impairments.    Personal Factors and Comorbidities Education;Social Background    Examination-Activity Limitations Stand;Sit;Bend;Lift    Examination-Participation Restrictions Cleaning;Laundry    Stability/Clinical Decision Making Stable/Uncomplicated    Clinical Decision Making Low    Rehab Potential Good    PT Frequency 2x / week    PT Duration 8 weeks    PT Treatment/Interventions ADLs/Self Care Home Management;Electrical Stimulation;Moist Heat;Traction;Ultrasound;Therapeutic exercise;Therapeutic activities;Functional mobility training;Balance training;Neuromuscular re-education;Patient/family education;Manual techniques;Dry needling;Iontophoresis 4mg /ml Dexamethasone;Gait training;Stair training;Passive range of motion    PT Next Visit Plan  review HEP, LE strength/flexibility, lumbar ROM and core stab, shoulder/thoracic ex's as indicated  PT Home Exercise Plan hamstring stretch, quad stretch, scap retractions in addition to ex's received from PT last year    Consulted and Agree with Plan of Care Patient           Patient will benefit from skilled therapeutic intervention in order to improve the following deficits and impairments:  Increased muscle spasms, Decreased range of motion, Decreased strength, Impaired flexibility, Postural dysfunction, Pain, Improper body mechanics  Visit Diagnosis: Acute bilateral low back pain without sciatica  Pain in thoracic spine  Cervicalgia  Muscle spasm of back  Chronic right shoulder pain     Problem List There are no problems to display for this patient.  Lysle Rubens, PT, DPT Maryanna Shape Morrissa Shein 02/15/2020, 1:51 PM  [facility] [place] [address] [place], [place], [address] Phone: [phone]   Fax:  [phone]  Name: MALAYIAH MCBRAYER MRN: [id number] Date of Birth: 1980-12-30

## 2020-02-15 NOTE — Patient Instructions (Signed)
Access Code: [email or web address] URL: [email or web address] Date: 02/15/2020 Prepared by: Lysle Rubens  Exercises Quadricep Stretch with Chair and Counter Support - 1 x daily - 7 x weekly - 3 sets - 2 reps - 30 sec hold Seated Table Hamstring Stretch - 1 x daily - 7 x weekly - 3 sets - 2 reps - 30 sec hold Seated Scapular Retraction - 1 x daily - 7 x weekly - 3 sets - 10 reps - 3 sec hold

## 2020-04-25 ENCOUNTER — Ambulatory Visit: Payer: Medicare Other | Attending: Physician Assistant | Admitting: Physical Therapy

## 2020-04-25 ENCOUNTER — Other Ambulatory Visit: Payer: Self-pay

## 2020-04-25 ENCOUNTER — Encounter: Payer: Self-pay | Admitting: Physical Therapy

## 2020-04-25 DIAGNOSIS — M542 Cervicalgia: Secondary | ICD-10-CM | POA: Insufficient documentation

## 2020-04-25 DIAGNOSIS — M25511 Pain in right shoulder: Secondary | ICD-10-CM | POA: Insufficient documentation

## 2020-04-25 DIAGNOSIS — M6281 Muscle weakness (generalized): Secondary | ICD-10-CM | POA: Insufficient documentation

## 2020-04-25 DIAGNOSIS — G8929 Other chronic pain: Secondary | ICD-10-CM | POA: Insufficient documentation

## 2020-04-25 DIAGNOSIS — M546 Pain in thoracic spine: Secondary | ICD-10-CM | POA: Diagnosis present

## 2020-04-25 NOTE — Therapy (Signed)
[facility] [place] [address] [place], [place], [address] Phone: [phone]   Fax:  [phone]  Physical Therapy Evaluation  Patient Details  Name: Kayla Proctor MRN: [id number] Date of Birth: 1981-05-01 Referring Provider (PT): Kayla Bhat PA-C   Encounter Date: 04/25/2020   PT End of Session - 04/25/20 1053    Visit Number 1    Number of Visits 12    Date for PT Re-Evaluation 06/06/20    Authorization Type MCR/MCD    PT Start Time 1053    PT Stop Time 1120    PT Time Calculation (min) 27 min    Activity Tolerance Patient tolerated treatment well    Behavior During Therapy [name] for tasks assessed/performed           Past Medical History:  Diagnosis Date   Anemia     History reviewed. No pertinent surgical history.  There were no vitals filed for this visit.    Subjective Assessment - 04/25/20 1053    Subjective Pt reports her MD was supposed tohavre sent the referral for her Shoulder/neck.  ( we do not have it ) She  has been doing her HEP about three times a day and the pain is now wrapping around her Rt side.    Limitations Lifting    Patient Stated Goals have less pain in her Rt shoulder    Currently in Pain? No/denies   has pain in the AM - has some stiffness then after exercise the pain is dull around her side like muscle work.             [name] PT Assessment - 04/25/20 0001      Assessment   Medical Diagnosis Rt shoulder pain    Referring Provider (PT) Kayla Bhat PA-C    Onset Date/Surgical Date 10/20/18    Hand Dominance Left    Prior Therapy Yes      Precautions   Precautions None      Balance Screen   Has the patient fallen in the past 6 months No    Has the patient had a decrease in activity level because of a fear of falling?  No    Is the patient reluctant to leave their home because of a fear of falling?  No      Home Environment   Additional Comments does  housework, cooking      Prior Function   Level of Independence Independent    Vocation On disability    Leisure walking      Observation/Other Assessments   Other Surveys  Quick Dash    Quick DASH  22.73 ability      Sensation   Light Touch Appears Intact    Hot/Cold Appears Intact      Posture/Postural Control   Posture Comments fwd head, rounded shoulders, slouched posture, increased thoracic kyphosis      ROM / Strength   AROM / PROM / Strength Strength;AROM      AROM   Overall AROM Comments cervical WNL - pain with extension and Lt rotation, bilat UE's WNL       Strength   Overall Strength Comments mid back 5-/5 Lt, 4/5 Rt       Palpation   Spinal mobility slight hypomobility in cervical and thoracic mobs - no pain    Palpation comment tightness in the Rt deltoid with trigger points.        Special  Tests    Special Tests Rotator Cuff Impingement    Rotator Cuff Impingment tests --   (-) on Rt side.                    [name] - 04/25/20 0001    Open a tight or new jar Mild difficulty    Do heavy household chores (wash walls, wash floors) Moderate difficulty    Carry a shopping bag or briefcase Moderate difficulty    Wash your back No difficulty    Use a knife to cut food No difficulty    Recreational activities in which you take some force or impact through your arm, shoulder, or hand (golf, hammering, tennis) Mild difficulty    During the past week, to what extent has your arm, shoulder or hand problem interfered with your normal social activities with family, friends, neighbors, or groups? Slightly    During the past week, to what extent has your arm, shoulder or hand problem limited your work or other regular daily activities Slightly    Arm, shoulder, or hand pain. Mild    Tingling (pins and needles) in your arm, shoulder, or hand None    Difficulty Sleeping Mild difficulty    DASH Score 22.73 %            Objective measurements completed on  examination: See above findings.     made modifications to her current HEP and added two stretches.             PT Short Term Goals - 04/25/20 1131      PT SHORT TERM GOAL #1   Title ------             PT Long Term Goals - 04/25/20 1131      PT LONG TERM GOAL #1   Title understand posture and body mechanics with daily activities around her house ( 06/06/2020)    Time 6    Period Weeks    Status New    Target Date 06/06/20      PT LONG TERM GOAL #2   Title decrease pain 50% in her Rt upper body with daily activity ( 06/06/2020)    Time 6    Period Weeks    Status New    Target Date 06/06/20      PT LONG TERM GOAL #3   Title report =/> 50% ability to sleep on her Rt side ( 06/06/2020)    Time 6    Period Weeks    Status New    Target Date 06/06/20      PT LONG TERM GOAL #4   Title improve quick DASH =/> 12% limited ( 06/06/2020)    Time 6    Period Weeks    Status New    Target Date 06/06/20      PT LONG TERM GOAL #5   Title improve Rt upper back strength to be = to Lt to support upright posture and limit pull on thoracic spine and shoulder ( 06/06/2020)    Time 6    Period Weeks    Status New    Target Date 06/06/20                  Plan - 04/25/20 1114    Clinical Impression Statement Kayla Proctor returns for therapy for Rt shoulder, neck and thoracic pain.  She was seen a couple of months ago for this and did not return for tx as  she reports her MD did not send an order over.  She is in the same position right now.She was informed that we will complete the eval today, send it to the MD office for authorization and then follow up with her to schedule treatments.  She was informed to call us next week if she has not heard back from [place].  Overall upper body ROM is WNL, does have some pain with endrange cervical extension and Lt rotation.  UE strength is good and painfree, she is weak in her upper back on the Rt side.  Pt also has postural changes lending to a  foward flexed posture in her upper body, she mammory hypertrophy the add to this.  The pain in her Rt shoudler/neck is interfering with her ability to sleep, lift items and perform heavy household tasks.  She would benefit from PT to increase strength, improve her posture to limited stress on her shoulders and back and restore her function.  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    Personal Factors and Comorbidities Education;Social Background    Examination-Activity Limitations Stand;Sit;Bend;Lift    Examination-Participation Restrictions Cleaning;Laundry    Stability/Clinical Decision Making Stable/Uncomplicated    Clinical Decision Making Low    Rehab Potential Good    PT Frequency 2x / week    PT Duration 6 weeks    PT Treatment/Interventions ADLs/Self Care Home Management;Electrical Stimulation;Moist Heat;Traction;Ultrasound;Therapeutic exercise;Therapeutic activities;Functional mobility training;Balance training;Neuromuscular re-education;Patient/family education;Manual techniques;Dry needling;Iontophoresis 4mg /ml Dexamethasone;Gait training;Stair training;Passive range of motion;Taping;Vasopneumatic Device;Spinal Manipulations;Cryotherapy    PT Next Visit Plan progress postural correction and upper back strength.    PT Home Exercise Plan [email or web address]    Consulted and Agree with Plan of Care Patient           Patient will benefit from skilled therapeutic intervention in order to improve the following deficits and impairments:  Increased muscle spasms, Decreased range of motion, Decreased strength, Postural dysfunction, Pain, Improper body mechanics, Impaired UE functional use  Visit Diagnosis: Chronic right shoulder pain - Plan: PT plan of care cert/re-cert  Cervicalgia - Plan: PT plan of care cert/re-cert  Pain in  thoracic spine - Plan: PT plan of care cert/re-cert  Muscle weakness (generalized) - Plan: PT plan of care cert/re-cert     Problem List There are no problems to display for this patient.   PT  04/25/2020, 11:35 AM  [facility] [place] [facility] [address] [place], [place], [place] Phone: [phone]   Fax:  [phone]  Name: Kayla Proctor MRN: Saundra Shelling Date of Birth: 1981/06/10

## 2020-04-25 NOTE — Patient Instructions (Signed)
Access Code: [email or web address] URL: [email or web address] Date: 04/25/2020 Prepared by: Roderic Scarce  Exercises Seated Thoracic Lumbar Extension with Pectoralis Stretch - 1-2 x daily - 1 reps - 20-30 hold Doorway Pec Stretch at 90 Degrees Abduction - 1-2 x daily - 1-2 reps - 20-30 hold

## 2020-05-21 ENCOUNTER — Other Ambulatory Visit: Payer: Self-pay

## 2020-05-21 ENCOUNTER — Encounter: Payer: Self-pay | Admitting: Physical Therapy

## 2020-05-21 ENCOUNTER — Ambulatory Visit: Payer: Medicare Other | Attending: Physician Assistant | Admitting: Physical Therapy

## 2020-05-21 DIAGNOSIS — M542 Cervicalgia: Secondary | ICD-10-CM | POA: Insufficient documentation

## 2020-05-21 DIAGNOSIS — M546 Pain in thoracic spine: Secondary | ICD-10-CM | POA: Diagnosis present

## 2020-05-21 DIAGNOSIS — G8929 Other chronic pain: Secondary | ICD-10-CM | POA: Diagnosis present

## 2020-05-21 DIAGNOSIS — M25511 Pain in right shoulder: Secondary | ICD-10-CM | POA: Diagnosis present

## 2020-05-21 NOTE — Therapy (Signed)
[facility] [place] [address] [place], [place], [address] Phone: [phone]   Fax:  [phone]  Physical Therapy Treatment  Patient Details  Name: Kayla WHITSITT MRN: [id number] Date of Birth: 1980/10/01 Referring Provider (PT): Madelaine Bhat PA-C   Encounter Date: 05/21/2020   PT End of Session - 05/21/20 1006    Visit Number 2    Date for PT Re-Evaluation 06/06/20    Authorization Type MCR/MCD    PT Start Time 0930    PT Stop Time 1000    PT Time Calculation (min) 30 min    Activity Tolerance Patient tolerated treatment well    Behavior During Therapy [facility] for tasks assessed/performed           Past Medical History:  Diagnosis Date  . Anemia     History reviewed. No pertinent surgical history.  There were no vitals filed for this visit.   Subjective Assessment - 05/21/20 0930    Subjective Doing good except for that hard fall last year    Currently in Pain? No/denies                             [facility] Adult PT Treatment/Exercise - 05/21/20 0001      Lumbar Exercises: Aerobic   UBE (Upper Arm Bike) L2.5 x each    Nustep L3 x 5 min      Lumbar Exercises: Standing   Row Theraband;20 reps;Strengthening    Theraband Level (Row) Level 2 (Red)    Shoulder Extension Theraband;20 reps;Both;Strengthening    Theraband Level (Shoulder Extension) Level 2 (Red)    Other Standing Lumbar Exercises ER yellow 2x10     Other Standing Lumbar Exercises        Shoulder Exercises: Standing   Horizontal ABduction Theraband;20 reps;Both;Strengthening    Theraband Level (Shoulder Horizontal ABduction) Level 1 (Yellow)    Other Standing Exercises Shrugs 5lb 2x10     Other Standing Exercises Triceps Ext 20lb                     PT Short Term Goals - 04/25/20 1131      PT SHORT TERM GOAL #1   Title ------             PT Long Term Goals - 04/25/20 1131      PT LONG TERM GOAL #1    Title understand posture and body mechanics with daily activities around her house ( 06/06/2020)    Time 6    Period Weeks    Status New    Target Date 06/06/20      PT LONG TERM GOAL #2   Title decrease pain 50% in her Rt upper body with daily activity ( 06/06/2020)    Time 6    Period Weeks    Status New    Target Date 06/06/20      PT LONG TERM GOAL #3   Title report =/> 50% ability to sleep on her Rt side ( 06/06/2020)    Time 6    Period Weeks    Status New    Target Date 06/06/20      PT LONG TERM GOAL #4   Title improve quick DASH =/> 12% limited ( 06/06/2020)    Time 6    Period Weeks    Status New    Target Date 06/06/20  PT LONG TERM GOAL #5   Title improve Rt upper back strength to be = to Lt to support upright posture and limit pull on thoracic spine and shoulder ( 06/06/2020)    Time 6    Period Weeks    Status New    Target Date 06/06/20                 Plan - 05/21/20 1006    Clinical Impression Statement Pt tolerated an initial progression to TE well. Constant cues given throughout to complete reps correctly and to maintain good posture. Postural compensation noted with row and extensions. Tactile cues to elbows to keep arm down with triceps extension.    Personal Factors and Comorbidities Education;Social Background    Examination-Activity Limitations Stand;Sit;Bend;Lift    Examination-Participation Restrictions Cleaning;Laundry    Stability/Clinical Decision Making Stable/Uncomplicated    Rehab Potential Good    PT Frequency 2x / week    PT Duration 6 weeks    PT Treatment/Interventions ADLs/Self Care Home Management;Electrical Stimulation;Moist Heat;Traction;Ultrasound;Therapeutic exercise;Therapeutic activities;Functional mobility training;Balance training;Neuromuscular re-education;Patient/family education;Manual techniques;Dry needling;Iontophoresis 4mg /ml Dexamethasone;Gait training;Stair training;Passive range of motion;Taping;Vasopneumatic  Device;Spinal Manipulations;Cryotherapy    PT Next Visit Plan progress postural correction and upper back strength.           Patient will benefit from skilled therapeutic intervention in order to improve the following deficits and impairments:  Increased muscle spasms, Decreased range of motion, Decreased strength, Postural dysfunction, Pain, Improper body mechanics, Impaired UE functional use  Visit Diagnosis: Chronic right shoulder pain  Cervicalgia  Pain in thoracic spine     Problem List There are no problems to display for this patient.   , PTA 05/21/2020, 10:08 AM  [facility] [place] [address] [place], [place], [place] Phone: [phone]   Fax:  [phone]  Name: LIVIAH CAKE MRN: Saundra Shelling Date of Birth: 1981/05/01

## 2020-05-23 ENCOUNTER — Encounter: Payer: Medicare Other | Admitting: Physical Therapy

## 2020-05-28 ENCOUNTER — Encounter: Payer: Medicare Other | Admitting: Physical Therapy

## 2020-05-30 ENCOUNTER — Ambulatory Visit: Payer: Medicare Other | Admitting: Physical Therapy

## 2020-05-30 ENCOUNTER — Other Ambulatory Visit: Payer: Self-pay

## 2020-05-30 ENCOUNTER — Encounter: Payer: Self-pay | Admitting: Physical Therapy

## 2020-05-30 DIAGNOSIS — M25511 Pain in right shoulder: Secondary | ICD-10-CM

## 2020-05-30 DIAGNOSIS — M542 Cervicalgia: Secondary | ICD-10-CM

## 2020-05-30 NOTE — Therapy (Signed)
[place]. [place], [place], [address] Phone: [phone]   Fax:  [phone]  Physical Therapy Treatment  Patient Details  Name: Kayla Proctor MRN: [id number] Date of Birth: [birth year] Referring Provider (PT): Letta Kocher PA-C   Encounter Date: 05/30/2020   PT End of Session - 05/30/20 0957    Visit Number 3    Date for PT Re-Evaluation 06/06/20    PT Start Time 0925    PT Stop Time 1005    PT Time Calculation (min) 40 min    Activity Tolerance Patient tolerated treatment well    Behavior During Therapy [facility] for tasks assessed/performed           Past Medical History:  Diagnosis Date  . Anemia     History reviewed. No pertinent surgical history.  There were no vitals filed for this visit.   Subjective Assessment - 05/30/20 0924    Subjective "Great" Some low back pain in the morning when she wakes up    Currently in Pain? No/denies                             [facility] Adult PT Treatment/Exercise - 05/30/20 0001      Lumbar Exercises: Aerobic   UBE (Upper Arm Bike) L3 x 72mn each    Nustep L5 x 4 min      Lumbar Exercises: Standing   Row Theraband;20 reps;Strengthening    Theraband Level (Row) Level 3 (Green)    Shoulder Extension Both;Strengthening;20 reps;Power TEngineering geologistLimitations 5    Other Standing Lumbar Exercises ER yellow 2x10       Lumbar Exercises: Seated   Sit to Stand 10 reps   x2 OHP yelow ball      Shoulder Exercises: Standing   Flexion Theraband;20 reps;Both;Strengthening    Shoulder Flexion Weight (lbs) 2    ABduction Strengthening;Both;Theraband;20 reps    Shoulder ABduction Weight (lbs) 2      Shoulder Exercises: ROM/Strengthening   Other ROM/Strengthening Exercises Rows & Lats 20lb 2x10                     PT Short Term Goals - 04/25/20 1131      PT SHORT TERM GOAL #1   Title ------             PT Long  Term Goals - 05/30/20 05916     PT LONG TERM GOAL #1   Title understand posture and body mechanics with daily activities around her house ( 06/06/2020)    Status Partially Met      PT LONG TERM GOAL #2   Title decrease pain 50% in her Rt upper body with daily activity ( 06/06/2020)    Status Partially Met      PT LONG TERM GOAL #3   Title report =/> 50% ability to sleep on her Rt side ( 06/06/2020)    Status Partially Met      PT LONG TERM GOAL #4   Title improve quick DASH =/> 12% limited ( 06/06/2020)                 Plan - 05/30/20 0959    Clinical Impression Statement Pt has progressed towards goals. She tolerated a progressed treatment session well. Core weakness present with standing shoulder extensions. Some R shoulder blade and flank tightness reported with external  rotation. Pulling sensation in R flank reported with standing shoulder abduction.    Personal Factors and Comorbidities Education;Social Background;Fitness    Examination-Activity Limitations Stand;Sit;Bend;Lift    Stability/Clinical Decision Making Stable/Uncomplicated    Rehab Potential Good    PT Frequency 2x / week    PT Duration 6 weeks    PT Treatment/Interventions ADLs/Self Care Home Management;Electrical Stimulation;Moist Heat;Traction;Ultrasound;Therapeutic exercise;Therapeutic activities;Functional mobility training;Balance training;Neuromuscular re-education;Patient/family education;Manual techniques;Dry needling;Iontophoresis 57m/ml Dexamethasone;Gait training;Stair training;Passive range of motion;Taping;Vasopneumatic Device;Spinal Manipulations;Cryotherapy    PT Next Visit Plan progress postural correction and upper back strength.           Patient will benefit from skilled therapeutic intervention in order to improve the following deficits and impairments:  Increased muscle spasms, Decreased range of motion, Decreased strength, Postural dysfunction, Pain, Improper body mechanics, Impaired UE  functional use  Visit Diagnosis: Cervicalgia  Chronic right shoulder pain     Problem List There are no problems to display for this patient.   RScot Jun PTA 05/30/2020, 10:01 AM  [place] [address]Phone: [phone]  Fax:  [phone] Name: Kayla MANGELSMRN: [id number] of Birth: [birth year]

## 2020-06-07 ENCOUNTER — Ambulatory Visit: Payer: Medicare Other | Attending: Physician Assistant | Admitting: Physical Therapy

## 2020-06-14 ENCOUNTER — Ambulatory Visit: Payer: Medicare Other | Admitting: Physical Therapy

## 2020-06-21 ENCOUNTER — Ambulatory Visit: Payer: Medicare Other | Admitting: Physical Therapy

## 2021-06-13 ENCOUNTER — Encounter (HOSPITAL_COMMUNITY): Payer: Self-pay | Admitting: Pharmacy Technician

## 2021-06-13 ENCOUNTER — Other Ambulatory Visit: Payer: Self-pay

## 2021-06-13 ENCOUNTER — Emergency Department (HOSPITAL_COMMUNITY)
Admission: EM | Admit: 2021-06-13 | Discharge: 2021-06-13 | Disposition: A | Discharge status: Home / Self Care | Payer: Medicare Other | Attending: Emergency Medicine | Admitting: Emergency Medicine

## 2021-06-13 DIAGNOSIS — M545 Low back pain, unspecified: Secondary | ICD-10-CM | POA: Insufficient documentation

## 2021-06-13 DIAGNOSIS — Z87891 Personal history of nicotine dependence: Secondary | ICD-10-CM | POA: Insufficient documentation

## 2021-06-13 DIAGNOSIS — X500XXA Overexertion from strenuous movement or load, initial encounter: Secondary | ICD-10-CM | POA: Diagnosis not present

## 2021-06-13 NOTE — ED Provider Notes (Signed)
[facility] EMERGENCY DEPARTMENT Provider Note   CSN: [id number] Arrival date & time: 06/13/21  1120     History No chief complaint on file.   Kayla Proctor is a 40 y.o. female who presents the emergency department with a 2-day history of lower back pain after lifting some furniture.  She complains of constant lower back pain characterized as a tight sensation.  Pain does not radiate.  She rates her back pain mild in severity.  She has not taken anything for her back pain.  Twisting and bending seem to make the pain worse.  She denies any bowel/bladder incontinence, fever, chills, history of cancer, intravenous drug use, focal weakness/numbness in the lower extremities.  HPI     Past Medical History:  Diagnosis Date   Anemia     There are no problems to display for this patient.   History reviewed. No pertinent surgical history.   OB History   No obstetric history on file.     No family history on file.  Social History   Tobacco Use   Smoking status: Former    Types: Cigarettes    Quit date: 05/03/2011    Years since quitting: 10.1   Smokeless tobacco: Former  Substance Use Topics   Alcohol use: No   Drug use: No    Home Medications Prior to Admission medications   Medication Sig Start Date End Date Taking? Authorizing Provider  ferrous sulfate 325 (65 FE) MG tablet Take 325 mg by mouth daily with breakfast. Patient not taking: Reported on 04/25/2020    [provider]  meloxicam (MOBIC) 7.5 MG tablet Take 1 tablet (7.5 mg total) by mouth daily. Patient not taking: Reported on 04/25/2020 05/20/19   Belinda Fisher, PA-C  methocarbamol (ROBAXIN) 500 MG tablet Take 1 tablet (500 mg total) by mouth 2 (two) times daily. 05/20/19   Cathie Hoops, Amy V, PA-C  albuterol (PROVENTIL HFA;VENTOLIN HFA) 108 (90 BASE) MCG/ACT inhaler Inhale 1-2 puffs into the lungs every 6 (six) hours as needed for wheezing. 07/13/12 05/20/19  Jimmie Molly, MD  loratadine (CLARITIN) 10  MG tablet Take 1 tablet (10 mg total) by mouth daily. 05/10/12 05/20/19  Elpidio Anis, PA-C  omeprazole (PRILOSEC) 20 MG capsule Take 1 capsule (20 mg total) by mouth daily. 09/17/12 05/20/19  Palumbo, April, MD    Allergies    Patient has no known allergies.  Review of Systems   Review of Systems  All other systems reviewed and are negative.  Physical Exam Updated Vital Signs BP 132/73 (BP Location: Right Arm)   Pulse 84   Temp 98.6 F (37 C) (Oral)   Resp 15   SpO2 100%   Physical Exam Vitals reviewed.  Constitutional:      Appearance: Normal appearance.  HENT:     Head: Normocephalic and atraumatic.  Eyes:     General:        Right eye: No discharge.        Left eye: No discharge.     Conjunctiva/sclera: Conjunctivae normal.  Pulmonary:     Effort: Pulmonary effort is normal.  Musculoskeletal:     Comments: Cervical, thoracic, and lumbar spine are nontender to palpation.  There is mild paralumbar muscular tenderness.  Negative straight leg raise.  5/5 strength in the lower extremities.  Normal sensation in the lower extremities.  Skin:    General: Skin is warm and dry.     Findings: No rash.  Neurological:  General: No focal deficit present.     Mental Status: She is alert.  Psychiatric:        Mood and Affect: Mood normal.        Behavior: Behavior normal.    ED Results / Procedures / Treatments   Labs (all labs ordered are listed, but only abnormal results are displayed) Labs Reviewed - No data to display  EKG None  Radiology No results found.  Procedures Procedures   Medications Ordered in ED Medications - No data to display  ED Course  I have reviewed the triage vital signs and the nursing notes.  Pertinent labs & imaging results that were available during my care of the patient were reviewed by me and considered in my medical decision making (see chart for details).    MDM Rules/Calculators/A&P                          ARRA CONNAUGHTON is a  40 y.o. female who presents to the emergency department for evaluation of lower back pain.  Given the acuity of her situation this is likely muscular in nature.  Have a low suspicion for fracture or dislocation in the lower back considering the mechanism of injury.  I would low suspicion for epidural abscess at this time given her lack of IV drug use and systemic symptoms.  I instructed patient this will get better in the next couple of weeks.  I also instructed her to start a Tylenol/ibuprofen regiment scheduled.  Patient understood and is amenable to this plan.  I will also have her follow-up with her primary care within the next week or so to ensure we have on the right direction.  Strict turn precautions given.   Final Clinical Impression(s) / ED Diagnoses Final diagnoses:  Acute low back pain without sciatica, unspecified back pain laterality    Rx / DC Orders ED Discharge Orders     None        Teressa Lower, [place] 06/13/21 1224    Tegeler, Canary Brim, MD 06/13/21 1505

## 2021-06-13 NOTE — ED Triage Notes (Signed)
Pt here with lower back pain after lifting furniture on Monday.

## 2021-06-13 NOTE — ED Notes (Signed)
Patient Alert and oriented to baseline. Stable and ambulatory to baseline. Patient verbalized understanding of the discharge instructions.  Patient belongings were taken by the patient.   

## 2021-06-13 NOTE — Discharge Instructions (Addendum)
You were seen and evaluated in the emergency department for further evaluation of lower back pain.  As we discussed, this is likely from the excess amount of lifting you are doing couple days ago.  This should get better with time.  Please take Tylenol/ibuprofen scheduled to ensure for adequate pain control.  Continue to gradually stretch like I demonstrated in the room.  Please return to the emergency department if you experience trouble walking, increased level of pain, numbness/weakness to your lower extremities, bowel/bladder incontinence, fevers, or any other concerns you may have.  Please follow-up with your primary care doctor within the next week to ensure we are going the right direction.

## 2023-01-26 ENCOUNTER — Encounter: Payer: Self-pay | Admitting: Emergency Medicine

## 2023-01-26 ENCOUNTER — Ambulatory Visit
Admission: EM | Admit: 2023-01-26 | Discharge: 2023-01-26 | Disposition: A | Discharge status: Home / Self Care | Payer: Medicare HMO | Attending: Internal Medicine | Admitting: Internal Medicine

## 2023-01-26 ENCOUNTER — Other Ambulatory Visit: Payer: Self-pay

## 2023-01-26 DIAGNOSIS — K0889 Other specified disorders of teeth and supporting structures: Secondary | ICD-10-CM | POA: Diagnosis not present

## 2023-01-26 MED ORDER — AMOXICILLIN-POT CLAVULANATE 875-125 MG PO TABS: 1.0000 | ORAL_TABLET | Freq: Two times a day (BID) | ORAL | 0 refills | Status: AC

## 2023-01-26 NOTE — ED Provider Notes (Signed)
[facility]    CSN: [id number] Arrival date & time: 01/26/23  0915      History   Chief Complaint Chief Complaint  Patient presents with   Dental Pain    HPI Kayla Proctor is a 42 y.o. female.   Patient presents with left-sided dental pain that started about 3 days ago.  Patient denies any trauma to the mouth.  Reports that it feels like the pain is radiating to the left side of face and up to ear.  She states that pain worsens when she lays on the left side of her face.  Denies fever or purulent drainage from the mouth.  Patient has taken Advil for pain.  She has an appointment with dentist next week.  Reports that she has a lot of sensitivity to liquids as well.   Dental Pain   Past Medical History:  Diagnosis Date   Anemia     There are no problems to display for this patient.   History reviewed. No pertinent surgical history.  OB History   No obstetric history on file.      Home Medications    Prior to Admission medications   Medication Sig Start Date End Date Taking? Authorizing Provider  amoxicillin-clavulanate (AUGMENTIN) 875-125 MG tablet Take 1 tablet by mouth every 12 (twelve) hours. 01/26/23  Yes Jonel Sick, Rolly Salter E, FNP  ferrous sulfate 325 (65 FE) MG tablet Take 325 mg by mouth daily with breakfast.    [provider]  meloxicam (MOBIC) 7.5 MG tablet Take 1 tablet (7.5 mg total) by mouth daily. Patient not taking: Reported on 04/25/2020 05/20/19   Belinda Fisher, PA-C  methocarbamol (ROBAXIN) 500 MG tablet Take 1 tablet (500 mg total) by mouth 2 (two) times daily. Patient not taking: Reported on 01/26/2023 05/20/19   Belinda Fisher, PA-C  albuterol (PROVENTIL HFA;VENTOLIN HFA) 108 (90 BASE) MCG/ACT inhaler Inhale 1-2 puffs into the lungs every 6 (six) hours as needed for wheezing. 07/13/12 05/20/19  Jimmie Molly, MD  loratadine (CLARITIN) 10 MG tablet Take 1 tablet (10 mg total) by mouth daily. 05/10/12 05/20/19  Elpidio Anis, PA-C  omeprazole  (PRILOSEC) 20 MG capsule Take 1 capsule (20 mg total) by mouth daily. 09/17/12 05/20/19  Palumbo, April, MD    Family History History reviewed. No pertinent family history.  Social History Social History   Tobacco Use   Smoking status: Former    Types: Cigarettes    Quit date: 05/03/2011    Years since quitting: 11.7   Smokeless tobacco: Former  Building services engineer Use: Never used  Substance Use Topics   Alcohol use: Yes   Drug use: No     Allergies   Patient has no known allergies.   Review of Systems Review of Systems Per HPI  Physical Exam Triage Vital Signs ED Triage Vitals  Enc Vitals Group     BP 01/26/23 0925 119/75     Pulse Rate 01/26/23 0925 76     Resp 01/26/23 0925 18     Temp 01/26/23 0925 99.1 F (37.3 C)     Temp src --      SpO2 01/26/23 0925 98 %     Weight --      Height --      Head Circumference --      Peak Flow --      Pain Score 01/26/23 0922 10     Pain Loc --  Pain Edu? --      Excl. in GC? --    No data found.  Updated Vital Signs BP 119/75 (BP Location: Left Arm)   Pulse 76   Temp 99.1 F (37.3 C)   Resp 18   LMP 01/14/2023   SpO2 98%   Visual Acuity Right Eye Distance:   Left Eye Distance:   Bilateral Distance:    Right Eye Near:   Left Eye Near:    Bilateral Near:     Physical Exam Constitutional:      General: She is not in acute distress.    Appearance: Normal appearance. She is not toxic-appearing or diaphoretic.  HENT:     Head: Normocephalic and atraumatic.     Left Ear: Tympanic membrane, ear canal and external ear normal.     Mouth/Throat:     Comments: No obvious gingival swelling, erythema, abnormal dentition, oral swelling, facial swelling noted on exam. Eyes:     Extraocular Movements: Extraocular movements intact.     Conjunctiva/sclera: Conjunctivae normal.  Pulmonary:     Effort: Pulmonary effort is normal.  Neurological:     General: No focal deficit present.     Mental Status: She is  alert and oriented to person, place, and time. Mental status is at baseline.  Psychiatric:        Mood and Affect: Mood normal.        Behavior: Behavior normal.        Thought Content: Thought content normal.        Judgment: Judgment normal.      UC Treatments / Results  Labs (all labs ordered are listed, but only abnormal results are displayed) Labs Reviewed - No data to display  EKG   Radiology No results found.  Procedures Procedures (including critical care time)  Medications Ordered in UC Medications - No data to display  Initial Impression / Assessment and Plan / UC Course  I have reviewed the triage vital signs and the nursing notes.  Pertinent labs & imaging results that were available during my care of the patient were reviewed by me and considered in my medical decision making (see chart for details).     There is no obvious abnormality noted on physical exam.  No obvious signs of salivary gland infection or parotitis.  I am going to treat with an Augmentin antibiotic to cover for any infection that is not visible given patient reports that pain is radiating up to the side of her face.  Advised to keep scheduled appointment with dentist for further evaluation and management.  Patient verbalized understanding and was agreeable with plan. Final Clinical Impressions(s) / UC Diagnoses   Final diagnoses:  Pain, dental     Discharge Instructions      I have prescribed you an antibiotic in case dental infection is present.  Keep scheduled appointment with dentist for further evaluation and management.    ED Prescriptions     Medication Sig Dispense Auth. Provider   amoxicillin-clavulanate (AUGMENTIN) 875-125 MG tablet Take 1 tablet by mouth every 12 (twelve) hours. 14 tablet [place], Acie Fredricks[place], [place]      PDMP not reviewed this encounter.   Gustavus Bryant, [place] 01/26/23 [phone]

## 2023-01-26 NOTE — ED Triage Notes (Signed)
Patient reports Saturday started having pain on left side of face, left ear, cold liquids causes pain to top and bottom teeth.  Biting down on something causes pain in mouth as well as left ear.  Lying on left side of face feels pressure  Took advil yesterday.  Reports she has a dentist appt next week.

## 2023-01-26 NOTE — Discharge Instructions (Addendum)
I have prescribed you an antibiotic in case dental infection is present.  Keep scheduled appointment with dentist for further evaluation and management.
